# Patient Record
Sex: Female | Born: 1990 | Hispanic: No | Marital: Married | Smoking: Never smoker
Health system: Southern US, Community
[De-identification: ages and names within clinical notes are randomized; demographics above are authoritative.]

## PROBLEM LIST (undated history)

## (undated) DIAGNOSIS — Z8489 Family history of other specified conditions: Secondary | ICD-10-CM

## (undated) DIAGNOSIS — D649 Anemia, unspecified: Secondary | ICD-10-CM

## (undated) DIAGNOSIS — K219 Gastro-esophageal reflux disease without esophagitis: Secondary | ICD-10-CM

## (undated) HISTORY — PX: WISDOM TOOTH EXTRACTION: SHX21

## (undated) HISTORY — PX: LASIK: SHX215

---
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
## 2011-03-16 LAB — URINE MICROSCOPIC-ADD ON

## 2011-03-16 LAB — URINALYSIS, ROUTINE W REFLEX MICROSCOPIC
Bilirubin Urine: NEGATIVE
Glucose, UA: NEGATIVE mg/dL
Nitrite: NEGATIVE
Protein, ur: 100 mg/dL — AB
Specific Gravity, Urine: 1.015 (ref 1.005–1.030)
Urobilinogen, UA: 0.2 mg/dL (ref 0.0–1.0)

## 2011-03-16 LAB — URINE CULTURE

## 2019-10-28 ENCOUNTER — Encounter: Payer: Self-pay | Admitting: Obstetrics and Gynecology

## 2019-10-28 ENCOUNTER — Ambulatory Visit (INDEPENDENT_AMBULATORY_CARE_PROVIDER_SITE_OTHER): Payer: BC Managed Care – PPO | Admitting: Obstetrics and Gynecology

## 2019-10-28 ENCOUNTER — Other Ambulatory Visit (HOSPITAL_COMMUNITY)
Admission: RE | Admit: 2019-10-28 | Discharge: 2019-10-28 | Disposition: A | Discharge status: Home / Self Care | Payer: BC Managed Care – PPO | Source: Ambulatory Visit | Attending: Obstetrics and Gynecology | Admitting: Obstetrics and Gynecology

## 2019-10-28 ENCOUNTER — Other Ambulatory Visit: Payer: Self-pay

## 2019-10-28 VITALS — BP 105/76 | HR 70 | Ht 65.0 in | Wt 158.6 lb

## 2019-10-28 DIAGNOSIS — Z0000 Encounter for general adult medical examination without abnormal findings: Secondary | ICD-10-CM

## 2019-10-28 DIAGNOSIS — Z6826 Body mass index (BMI) 26.0-26.9, adult: Secondary | ICD-10-CM | POA: Diagnosis not present

## 2019-10-28 NOTE — Progress Notes (Signed)
Subjective:     Linda Madden is a single 28 y.o. female and is here for a comprehensive physical exam. She is not currently sexually active. 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 Lives with her mother. Is not currently exercising. The patient reports no problems.  Social History   Socioeconomic History  . Marital status: Single    Spouse name: Not on file  . Number of children: 0  . Years of education: Not on file  . Highest education level: Not on file  Occupational History  . Not on file  Social Needs  . Financial resource strain: Not on file  . Food insecurity    Worry: Not on file    Inability: Not on file  . Transportation needs    Medical: Not on file    Non-medical: Not on file  Tobacco Use  . Smoking status: Never Smoker  . Smokeless tobacco: Never Used  Substance and Sexual Activity  . Alcohol use: Never    Frequency: Never  . Drug use: Never  . Sexual activity: Not Currently  Lifestyle  . Physical activity    Days per week: 0 days    Minutes per session: 0 min  . Stress: Not on file  Relationships  . Social Herbalist on phone: Not on file    Gets together: Not on file    Attends religious service: Not on file    Active member of club or organization: Not on file    Attends meetings of clubs or organizations: Not on file    Relationship status: Not on file  . Intimate partner violence    Fear of current or ex partner: Not on file    Emotionally abused: Not on file    Physically abused: Not on file    Forced sexual activity: Not on file  Other Topics Concern  . Not on file  Social History Narrative  . Not on file   Health Maintenance  Topic Date Due  . HIV Screening  12/28/2007  . TETANUS/TDAP  12/28/2011  . PAP-Cervical Cytology Screening  12/27/2013  . PAP SMEAR-Modifier  12/27/2013  . INFLUENZA VACCINE  07/30/2019    The following portions of the patient's history were reviewed and updated as appropriate:  allergies, current medications, past family history, past medical history, past social history, past surgical history and problem list.  Review of Systems A comprehensive review of systems was negative.   Objective:    BP 105/76   Pulse 70   Ht 5\' 5"  (1.651 m)   Wt 158 lb 9 oz (71.9 kg)   LMP 10/17/2019 (Exact Date)   BMI 26.39 kg/m  General appearance: alert, cooperative and appears stated age Neck: no adenopathy, no carotid bruit, no JVD, supple, symmetrical, trachea midline and thyroid not enlarged, symmetric, no tenderness/mass/nodules Lungs: clear to auscultation bilaterally Breasts: normal appearance, no masses or tenderness Heart: regular rate and rhythm, S1, S2 normal, no murmur, click, rub or gallop Abdomen: soft, non-tender; bowel sounds normal; no masses,  no organomegaly Pelvic: cervix normal in appearance, external genitalia normal, no adnexal masses or tenderness, no cervical motion tenderness, rectovaginal septum normal, uterus normal size, shape, and consistency and vagina normal without discharge    Assessment:    Healthy female exam. BMI 26     Plan:  Reassured of normal findings. RTC 1 year or as needed. Labs & pap obtained-will follow up accordingly.  Tanishia Lemaster,CNM   See  After Visit Summary for Counseling Recommendations

## 2019-10-28 NOTE — Patient Instructions (Signed)
Place annual gynecologic exam patient instructions here.

## 2019-10-28 NOTE — Progress Notes (Signed)
New patient is here for an establishment visit. Patient states she has never had a pap smear. Is not on birth control.

## 2019-10-29 LAB — LIPID PANEL
Chol/HDL Ratio: 3.7 ratio (ref 0.0–4.4)
Cholesterol, Total: 173 mg/dL (ref 100–199)
HDL: 47 mg/dL (ref >39)
LDL Chol Calc (NIH): 113 mg/dL — ABNORMAL HIGH (ref 0–99)
Triglycerides: 70 mg/dL (ref 0–149)
VLDL Cholesterol Cal: 13 mg/dL (ref 5–40)

## 2019-10-29 LAB — COMPREHENSIVE METABOLIC PANEL
ALT: 20 IU/L (ref 0–32)
AST: 17 IU/L (ref 0–40)
Albumin/Globulin Ratio: 1.6 (ref 1.2–2.2)
Albumin: 4.8 g/dL (ref 3.9–5.0)
Alkaline Phosphatase: 57 IU/L (ref 39–117)
BUN/Creatinine Ratio: 14 (ref 9–23)
BUN: 9 mg/dL (ref 6–20)
Bilirubin Total: 0.3 mg/dL (ref 0.0–1.2)
CO2: 23 mmol/L (ref 20–29)
Calcium: 9.6 mg/dL (ref 8.7–10.2)
Chloride: 101 mmol/L (ref 96–106)
Creatinine, Ser: 0.66 mg/dL (ref 0.57–1.00)
GFR calc Af Amer: 141 mL/min/{1.73_m2} (ref >59)
GFR calc non Af Amer: 122 mL/min/{1.73_m2} (ref >59)
Globulin, Total: 3 g/dL (ref 1.5–4.5)
Glucose: 85 mg/dL (ref 65–99)
Potassium: 3.8 mmol/L (ref 3.5–5.2)
Sodium: 139 mmol/L (ref 134–144)
Total Protein: 7.8 g/dL (ref 6.0–8.5)

## 2019-10-29 LAB — CBC
Hematocrit: 37.3 % (ref 34.0–46.6)
Hemoglobin: 12.3 g/dL (ref 11.1–15.9)
MCH: 27.5 pg (ref 26.6–33.0)
MCHC: 33 g/dL (ref 31.5–35.7)
MCV: 83 fL (ref 79–97)
Platelets: 286 10*3/uL (ref 150–450)
RBC: 4.48 x10E6/uL (ref 3.77–5.28)
RDW: 13.4 % (ref 11.7–15.4)
WBC: 3.9 10*3/uL (ref 3.4–10.8)

## 2019-10-29 LAB — FERRITIN: Ferritin: 14 ng/mL — ABNORMAL LOW (ref 15–150)

## 2019-11-02 LAB — CYTOLOGY - PAP
Chlamydia: NEGATIVE
Comment: NEGATIVE
Comment: NORMAL
Diagnosis: NEGATIVE
Neisseria Gonorrhea: NEGATIVE

## 2021-05-22 ENCOUNTER — Ambulatory Visit: Payer: BC Managed Care – PPO | Admitting: Internal Medicine

## 2022-01-08 ENCOUNTER — Encounter: Payer: Self-pay | Admitting: Obstetrics and Gynecology

## 2022-01-08 ENCOUNTER — Ambulatory Visit: Payer: Managed Care, Other (non HMO) | Admitting: Obstetrics and Gynecology

## 2022-01-08 ENCOUNTER — Other Ambulatory Visit: Payer: Self-pay

## 2022-01-08 ENCOUNTER — Other Ambulatory Visit (HOSPITAL_COMMUNITY)
Admission: RE | Admit: 2022-01-08 | Discharge: 2022-01-08 | Disposition: A | Discharge status: Home / Self Care | Payer: Managed Care, Other (non HMO) | Source: Ambulatory Visit | Attending: Obstetrics and Gynecology | Admitting: Obstetrics and Gynecology

## 2022-01-08 VITALS — BP 124/88 | HR 77 | Ht 65.0 in | Wt 166.0 lb

## 2022-01-08 DIAGNOSIS — Z789 Other specified health status: Secondary | ICD-10-CM

## 2022-01-08 DIAGNOSIS — N923 Ovulation bleeding: Secondary | ICD-10-CM | POA: Diagnosis not present

## 2022-01-08 NOTE — Progress Notes (Signed)
° ° °  GYNECOLOGY PROGRESS NOTE  Subjective:    Patient ID: Linda Madden, female    DOB: [birth year], 31 y.o.   MRN: [id number]  HPI  Patient is a 31 y.o. G0P0000 female who presents for complaints of irregular cycles. Previously has a h/o normal cycles, lasting 6 days, occurring regularly every 28-30 days.  October-November began having spotting in between, usually 1-2 days.  Then November-December had 10 days of spotting with only 4 day cycle.  Then between December-January had 6 days of spotting, but cycle returned to 6 days. Reports that she and her husband have been attempting conception since July of last year. Has never been on birth control. Denies any postcoital bleeding or vaginal discharge. Has been tracking cycles and using ovulation kits, getting positive results. Denies any recent stressors, significant weight changes, new medications.   Last pap smear was 09/2019 with Melody Shambley,CNM, and was normal.   The following portions of the patient's history were reviewed and updated as appropriate:  She  has no past medical history on file.  She  has a past surgical history that includes Wisdom tooth extraction and LASIK.  Her family history includes Cancer in her mother; Thyroid disease in her mother.  She  reports that she has never smoked. She has never used smokeless tobacco. She reports that she does not drink alcohol and does not use drugs.  Current Outpatient Medications on File Prior to Visit  Medication Sig Dispense Refill   loratadine (CLARITIN) 10 MG tablet Take 10 mg by mouth daily.     No current facility-administered medications on file prior to visit.   She has No Known Allergies..  Review of Systems Pertinent items noted in HPI and remainder of comprehensive ROS otherwise negative.   Objective:   Blood pressure 124/88, pulse 77, height 5\' 5"  (1.651 m), weight 166 lb (75.3 kg), last menstrual period 01/04/2022, SpO2 99 %. Body mass index is 27.62  kg/m. General appearance: alert and no distress Abdomen: soft, non-tender; bowel sounds normal; no masses,  no organomegaly Pelvic: external genitalia normal, rectovaginal septum normal.  Vagina with scant thin white discharge.  Cervix normal appearing, no lesions and no motion tenderness, scant clear mucoid discharge present.  Uterus mobile, nontender, normal shape and size.  Adnexae non-palpable, nontender bilaterally.  Extremities: extremities normal, atraumatic, no cyanosis or edema Neurologic: Grossly normal   Assessment:   1. Intermenstrual bleeding   2. Attempting to conceive     Plan:   - Unclear cause new onset of intermenstrual spotting. Nuswab performed today to r/o cervicitis (although patient denies any symptoms).  Will also check hormone levels. She will soon be ovulating in a few days.  Lastly, will schedule ultrasound to rule out possible polyp or other structural cause for the bleeding.  - Will notify patient of all results via Mychart, and schedule appropriate follow up as mandated.  - Attempting to conceive, discussed timed coitus, continue use of ovulation kits and menstrual cycle calendar.    Rubie Maid, MD [facility]

## 2022-01-08 NOTE — Patient Instructions (Signed)
Dysfunctional Uterine Bleeding Dysfunctional uterine bleeding is abnormal bleeding from the uterus. Dysfunctional uterine bleeding includes: A menstrual period that comes earlier or later than usual. A menstrual period that is lighter or heavier than usual, or has large blood clots. Vaginal bleeding between menstrual periods. Skipping one or more menstrual periods. Vaginal bleeding after sex. Vaginal bleeding after menopause. Follow these instructions at home: Eating and drinking  Eat well-balanced meals. Include foods that are high in iron, such as liver, meat, shellfish, green leafy vegetables, and eggs. To prevent or treat constipation, your health care provider may recommend that you: Drink enough fluid to keep your urine pale yellow. Take over-the-counter or prescription medicines. Eat foods that are high in fiber, such as beans, whole grains, and fresh fruits and vegetables. Limit foods that are high in fat and processed sugars, such as fried or sweet foods. Medicines Take over-the-counter and prescription medicines only as told by your health care provider. Do not change medicines without talking with your health care provider. Aspirin or medicines that contain aspirin may make the bleeding worse. Do not take those medicines: During the week before your menstrual period. During your menstrual period. If you were prescribed iron pills, take them as told by your health care provider. Iron pills help to replace iron that your body loses because of this condition. Activity If you need to change your sanitary pad or tampon more than one time every 2 hours: Lie in bed with your feet raised (elevated). Place a cold pack on your lower abdomen. Rest as much as possible until the bleeding stops or slows down. Do not try to lose weight until the bleeding has stopped and your blood iron level is back to normal. General instructions  For two months, write down: When your menstrual period  starts. When your menstrual period ends. When any abnormal vaginal bleeding occurs. What problems you notice. Keep all follow up visits as told by your health care provider. This is important. Contact a health care provider if you: Feel light-headed or weak. Have nausea and vomiting. Cannot eat or drink without vomiting. Feel dizzy or have diarrhea while you are taking medicines. Are taking birth control pills or hormones, and you want to change them or stop taking them. Get help right away if: You develop a fever or chills. You need to change your sanitary pad or tampon more than one time per hour. Your vaginal bleeding becomes heavier, or your flow contains clots more often. You develop pain in your abdomen. You lose consciousness. You develop a rash. Summary Dysfunctional uterine bleeding is abnormal bleeding from the uterus. It includes menstrual bleeding of abnormal duration, volume, or regularity. Bleeding after sex and after menopause are also considered dysfunctional uterine bleeding. This information is not intended to replace advice given to you by your health care provider. Make sure you discuss any questions you have with your health care provider. Document Revised: 05/26/2018 Document Reviewed: 05/26/2018 Elsevier Patient Education  2022 Elsevier Inc.  

## 2022-01-09 LAB — FSH/LH
FSH: 4.4 m[IU]/mL
LH: 6 m[IU]/mL

## 2022-01-09 LAB — ESTRADIOL: Estradiol: 75.6 pg/mL

## 2022-01-09 LAB — PROGESTERONE: Progesterone: 0.2 ng/mL

## 2022-01-10 ENCOUNTER — Ambulatory Visit (INDEPENDENT_AMBULATORY_CARE_PROVIDER_SITE_OTHER): Payer: Managed Care, Other (non HMO)

## 2022-01-10 ENCOUNTER — Other Ambulatory Visit: Payer: Self-pay

## 2022-01-10 DIAGNOSIS — N923 Ovulation bleeding: Secondary | ICD-10-CM

## 2022-01-10 LAB — CERVICOVAGINAL ANCILLARY ONLY
Bacterial Vaginitis (gardnerella): NEGATIVE
Candida Glabrata: NEGATIVE
Candida Vaginitis: NEGATIVE
Chlamydia: NEGATIVE
Comment: NEGATIVE
Comment: NEGATIVE
Comment: NEGATIVE
Comment: NEGATIVE
Comment: NEGATIVE
Comment: NORMAL
Neisseria Gonorrhea: NEGATIVE
Trichomonas: NEGATIVE

## 2022-01-15 ENCOUNTER — Encounter: Payer: Self-pay | Admitting: Obstetrics and Gynecology

## 2022-01-17 MED ORDER — PROGESTERONE MICRONIZED 100 MG PO CAPS: 100.0000 mg | ORAL_CAPSULE | Freq: Every day | ORAL | 3 refills | Status: DC

## 2022-01-26 ENCOUNTER — Encounter: Payer: Self-pay | Admitting: Obstetrics and Gynecology

## 2022-01-27 ENCOUNTER — Emergency Department: Payer: Managed Care, Other (non HMO)

## 2022-01-27 ENCOUNTER — Encounter: Payer: Self-pay | Admitting: Emergency Medicine

## 2022-01-27 ENCOUNTER — Emergency Department
Admission: EM | Admit: 2022-01-27 | Discharge: 2022-01-27 | Disposition: A | Discharge status: Home / Self Care | Payer: Managed Care, Other (non HMO) | Attending: Emergency Medicine | Admitting: Emergency Medicine

## 2022-01-27 ENCOUNTER — Other Ambulatory Visit: Payer: Self-pay

## 2022-01-27 DIAGNOSIS — O3680X0 Pregnancy with inconclusive fetal viability, not applicable or unspecified: Secondary | ICD-10-CM

## 2022-01-27 DIAGNOSIS — O209 Hemorrhage in early pregnancy, unspecified: Secondary | ICD-10-CM | POA: Diagnosis present

## 2022-01-27 DIAGNOSIS — N9489 Other specified conditions associated with female genital organs and menstrual cycle: Secondary | ICD-10-CM | POA: Insufficient documentation

## 2022-01-27 DIAGNOSIS — O4690 Antepartum hemorrhage, unspecified, unspecified trimester: Secondary | ICD-10-CM

## 2022-01-27 DIAGNOSIS — N939 Abnormal uterine and vaginal bleeding, unspecified: Secondary | ICD-10-CM

## 2022-01-27 LAB — HCG, QUANTITATIVE, PREGNANCY: hCG, Beta Chain, Quant, S: 30 m[IU]/mL — ABNORMAL HIGH (ref <5)

## 2022-01-27 LAB — URINALYSIS, ROUTINE W REFLEX MICROSCOPIC
Bilirubin Urine: NEGATIVE
Glucose, UA: NEGATIVE mg/dL
Ketones, ur: NEGATIVE mg/dL
Leukocytes,Ua: NEGATIVE
Nitrite: NEGATIVE
Protein, ur: NEGATIVE mg/dL
Specific Gravity, Urine: 1.015 (ref 1.005–1.030)
pH: 8.5 — ABNORMAL HIGH (ref 5.0–8.0)

## 2022-01-27 LAB — BASIC METABOLIC PANEL
Anion gap: 12 (ref 5–15)
BUN: 10 mg/dL (ref 6–20)
CO2: 23 mmol/L (ref 22–32)
Calcium: 9.4 mg/dL (ref 8.9–10.3)
Chloride: 101 mmol/L (ref 98–111)
Creatinine, Ser: 0.7 mg/dL (ref 0.44–1.00)
GFR, Estimated: >60 mL/min (ref >60)
Glucose, Bld: 83 mg/dL (ref 70–99)
Potassium: 3.6 mmol/L (ref 3.5–5.1)
Sodium: 136 mmol/L (ref 135–145)

## 2022-01-27 LAB — CBC WITH DIFFERENTIAL/PLATELET
Abs Immature Granulocytes: 0.02 10*3/uL (ref 0.00–0.07)
Basophils Absolute: 0.1 10*3/uL (ref 0.0–0.1)
Basophils Relative: 1 %
Eosinophils Absolute: 0.1 10*3/uL (ref 0.0–0.5)
Eosinophils Relative: 2 %
HCT: 40 % (ref 36.0–46.0)
Hemoglobin: 13.2 g/dL (ref 12.0–15.0)
Immature Granulocytes: 0 %
Lymphocytes Relative: 26 %
Lymphs Abs: 2.3 10*3/uL (ref 0.7–4.0)
MCH: 28.4 pg (ref 26.0–34.0)
MCHC: 33 g/dL (ref 30.0–36.0)
MCV: 86.2 fL (ref 80.0–100.0)
Monocytes Absolute: 0.7 10*3/uL (ref 0.1–1.0)
Monocytes Relative: 8 %
Neutro Abs: 5.5 10*3/uL (ref 1.7–7.7)
Neutrophils Relative %: 63 %
Platelets: 321 10*3/uL (ref 150–400)
RBC: 4.64 MIL/uL (ref 3.87–5.11)
RDW: 12.7 % (ref 11.5–15.5)
WBC: 8.7 10*3/uL (ref 4.0–10.5)
nRBC: 0 % (ref 0.0–0.2)

## 2022-01-27 LAB — URINALYSIS, MICROSCOPIC (REFLEX): RBC / HPF: >50 RBC/hpf (ref 0–5)

## 2022-01-27 LAB — POC URINE PREG, ED: Preg Test, Ur: POSITIVE — AB

## 2022-01-27 LAB — ABO/RH: ABO/RH(D): B NEG

## 2022-01-27 NOTE — ED Triage Notes (Signed)
Pt via POV from home. Pt c/o vaginal bleeding since yesterday. States that she is also having lower abd cramping. Pt is a couple weeks pregnant. Pt is A&OX4 and NAD

## 2022-01-27 NOTE — Discharge Instructions (Signed)
Call your OB/GYN office to let them know that you were seen in the emergency department.  You should have a repeat beta in 72 hours.  Also it was too early to see anything on ultrasound and the radiologist is suggesting that a repeat ultrasound be done in 14 days.  Take it easy for right now.  Avoid medications other than what is recommended by your OB/GYN.

## 2022-01-27 NOTE — ED Provider Notes (Signed)
[facility] Provider Note    Event Date/Time   First MD Initiated Contact with Patient 01/27/22 1238     (approximate)   History   Vaginal Bleeding   HPI  Linda Madden is a 31 y.o. female presents to the ED with complaint of vaginal bleeding that started yesterday.  Patient states that she had some spotting a panty liner yesterday.  She then went home and rested for the remainder of the day and it had stopped.  This morning when she got up she again had some spotting.  She is also complaining of some cramping but no localized pain.  She denies any fever, chills or urinary symptoms.  Patient took a home pregnancy test which was positive.     Physical Exam   Triage Vital Signs: ED Triage Vitals  Enc Vitals Group     BP 01/27/22 1213 111/75     Pulse Rate 01/27/22 1213 75     Resp 01/27/22 1213 18     Temp 01/27/22 1213 98.8 F (37.1 C)     Temp Source 01/27/22 1213 Oral     SpO2 01/27/22 1213 98 %     Weight 01/27/22 1151 162 lb (73.5 kg)     Height 01/27/22 1151 5\' 5"  (1.651 m)     Head Circumference --      Peak Flow --      Pain Score 01/27/22 1151 4     Pain Loc --      Pain Edu? --      Excl. in GC? --     Most recent vital signs: Vitals:   01/27/22 1400 01/27/22 1415  BP: 106/70   Pulse: 68 (!) 32  Resp: 18   Temp:    SpO2: 98% 100%     General: Awake, no distress.  Alert, talkative and cooperative. CV:  Good peripheral perfusion.  Heart regular rate and rhythm without murmur. Resp:  Normal effort.  Lungs are clear bilaterally. Abd:  No distention.  Soft, nontender, bowel sounds normoactive x4 quadrants.    ED Results / Procedures / Treatments   Labs (all labs ordered are listed, but only abnormal results are displayed) Labs Reviewed  HCG, QUANTITATIVE, PREGNANCY - Abnormal; Notable for the following components:      Result Value   hCG, Beta Chain, Quant, S 30 (*)    All other components within normal limits   URINALYSIS, ROUTINE W REFLEX MICROSCOPIC - Abnormal; Notable for the following components:   pH 8.5 (*)    Hgb urine dipstick LARGE (*)    All other components within normal limits  URINALYSIS, MICROSCOPIC (REFLEX) - Abnormal; Notable for the following components:   Bacteria, UA RARE (*)    All other components within normal limits  POC URINE PREG, ED - Abnormal; Notable for the following components:   Preg Test, Ur Positive (*)    All other components within normal limits  CBC WITH DIFFERENTIAL/PLATELET  BASIC METABOLIC PANEL  ABO/RH     RADIOLOGY  Ultrasound image was reviewed and radiology report.  This time there is no IUP, fetal pole, heart beat noted.  Radiology reports that it is too early to see and cannot exclude a ectopic pregnancy.    PROCEDURES:  Critical Care performed:   Procedures   MEDICATIONS ORDERED IN ED: Medications - No data to display   IMPRESSION / MDM / ASSESSMENT AND PLAN / ED COURSE  I reviewed the triage vital signs  and the nursing notes.   Differential diagnosis includes, but is not limited to, early pregnancy, spontaneous abortion, bleeding with pregnancy, ectopic pregnancy,  31 year old female presents to the ED with complaint of some spotting yesterday while wearing a panty liner.  She states that she went home and rested and bleeding stopped.  She states this morning that she saw some more blood but has not completely filled 1 panty liner today.  Patient denies any abdominal pain but states there has been some cramping.  CBC was reassuring along with basic metabolic panel.  hCG quantitative is 30, ABO/Rh was being negative, and urinalysis showed large amount of blood most likely vaginal contaminant.  Review of ultrasound and radiology report that it is too early to visualize a fetal pole, heartbeat, IUP and should be repeated in 14 days.  Patient already is established with Dr. Valentino Saxon who will be her OB/GYN.  She will also call to make  arrangements to have a beta-hCG in 72 hours and a repeat ultrasound in the office.     FINAL CLINICAL IMPRESSION(S) / ED DIAGNOSES   Final diagnoses:  Pregnancy of unknown anatomic location  Vaginal bleeding in pregnancy     Rx / DC Orders   ED Discharge Orders     None        Note:  This document was prepared using Dragon voice recognition software and may include unintentional dictation errors.   Tommi Rumps, PA-C 01/27/22 1631    Georga Hacking, MD 01/28/22 0700

## 2022-01-28 ENCOUNTER — Other Ambulatory Visit: Payer: Self-pay | Admitting: Obstetrics and Gynecology

## 2022-01-28 DIAGNOSIS — O209 Hemorrhage in early pregnancy, unspecified: Secondary | ICD-10-CM

## 2022-01-30 ENCOUNTER — Other Ambulatory Visit: Payer: Managed Care, Other (non HMO)

## 2022-01-30 ENCOUNTER — Other Ambulatory Visit: Payer: Self-pay

## 2022-01-30 DIAGNOSIS — O209 Hemorrhage in early pregnancy, unspecified: Secondary | ICD-10-CM

## 2022-01-31 LAB — HUMAN CHORIONIC GONADOTROPIN(HCG),B-SUBUNIT,QUANTITATIVE): HCG, Beta Chain, Quant, S: 105 m[IU]/mL

## 2022-02-14 ENCOUNTER — Emergency Department: Payer: Managed Care, Other (non HMO)

## 2022-02-14 ENCOUNTER — Other Ambulatory Visit: Payer: Self-pay

## 2022-02-14 ENCOUNTER — Emergency Department
Admission: EM | Admit: 2022-02-14 | Discharge: 2022-02-14 | Disposition: A | Discharge status: Home / Self Care | Payer: Managed Care, Other (non HMO) | Attending: Emergency Medicine | Admitting: Emergency Medicine

## 2022-02-14 DIAGNOSIS — O26891 Other specified pregnancy related conditions, first trimester: Secondary | ICD-10-CM | POA: Insufficient documentation

## 2022-02-14 DIAGNOSIS — Z20822 Contact with and (suspected) exposure to covid-19: Secondary | ICD-10-CM | POA: Insufficient documentation

## 2022-02-14 DIAGNOSIS — R1031 Right lower quadrant pain: Secondary | ICD-10-CM | POA: Insufficient documentation

## 2022-02-14 DIAGNOSIS — Z3A00 Weeks of gestation of pregnancy not specified: Secondary | ICD-10-CM | POA: Insufficient documentation

## 2022-02-14 DIAGNOSIS — N939 Abnormal uterine and vaginal bleeding, unspecified: Secondary | ICD-10-CM

## 2022-02-14 DIAGNOSIS — O209 Hemorrhage in early pregnancy, unspecified: Secondary | ICD-10-CM | POA: Diagnosis not present

## 2022-02-14 DIAGNOSIS — O00101 Right tubal pregnancy without intrauterine pregnancy: Secondary | ICD-10-CM

## 2022-02-14 LAB — CBC WITH DIFFERENTIAL/PLATELET
Abs Immature Granulocytes: 0.03 10*3/uL (ref 0.00–0.07)
Basophils Absolute: 0 10*3/uL (ref 0.0–0.1)
Basophils Relative: 0 %
Eosinophils Absolute: 0.1 10*3/uL (ref 0.0–0.5)
Eosinophils Relative: 2 %
HCT: 36.3 % (ref 36.0–46.0)
Hemoglobin: 12 g/dL (ref 12.0–15.0)
Immature Granulocytes: 0 %
Lymphocytes Relative: 24 %
Lymphs Abs: 1.9 10*3/uL (ref 0.7–4.0)
MCH: 28.1 pg (ref 26.0–34.0)
MCHC: 33.1 g/dL (ref 30.0–36.0)
MCV: 85 fL (ref 80.0–100.0)
Monocytes Absolute: 0.5 10*3/uL (ref 0.1–1.0)
Monocytes Relative: 6 %
Neutro Abs: 5.3 10*3/uL (ref 1.7–7.7)
Neutrophils Relative %: 68 %
Platelets: 281 10*3/uL (ref 150–400)
RBC: 4.27 MIL/uL (ref 3.87–5.11)
RDW: 13.1 % (ref 11.5–15.5)
WBC: 7.9 10*3/uL (ref 4.0–10.5)
nRBC: 0 % (ref 0.0–0.2)

## 2022-02-14 LAB — TYPE AND SCREEN
ABO/RH(D): B NEG
Antibody Screen: NEGATIVE

## 2022-02-14 LAB — BASIC METABOLIC PANEL
Anion gap: 7 (ref 5–15)
BUN: 9 mg/dL (ref 6–20)
CO2: 27 mmol/L (ref 22–32)
Calcium: 9.7 mg/dL (ref 8.9–10.3)
Chloride: 104 mmol/L (ref 98–111)
Creatinine, Ser: 0.55 mg/dL (ref 0.44–1.00)
GFR, Estimated: >60 mL/min (ref >60)
Glucose, Bld: 106 mg/dL — ABNORMAL HIGH (ref 70–99)
Potassium: 3.9 mmol/L (ref 3.5–5.1)
Sodium: 138 mmol/L (ref 135–145)

## 2022-02-14 LAB — HCG, QUANTITATIVE, PREGNANCY: hCG, Beta Chain, Quant, S: 3117 m[IU]/mL — ABNORMAL HIGH (ref <5)

## 2022-02-14 LAB — RESP PANEL BY RT-PCR (FLU A&B, COVID) ARPGX2
Influenza A by PCR: NEGATIVE
Influenza B by PCR: NEGATIVE
SARS Coronavirus 2 by RT PCR: NEGATIVE

## 2022-02-14 MED ORDER — METHOTREXATE FOR ECTOPIC PREGNANCY
50.0000 mg/m2 | Freq: Once | INTRAMUSCULAR | Status: AC
  Administered 2022-02-14: 92.5 mg via INTRAMUSCULAR
  Filled 2022-02-14: qty 3.7

## 2022-02-14 MED ORDER — RHO D IMMUNE GLOBULIN 1500 UNIT/2ML IJ SOSY
300.0000 ug | PREFILLED_SYRINGE | Freq: Once | INTRAMUSCULAR | Status: AC
Start: 2022-02-14 — End: 2022-02-14
  Administered 2022-02-14: 300 ug via INTRAMUSCULAR
  Filled 2022-02-14: qty 2

## 2022-02-14 NOTE — ED Notes (Signed)
See triage note  states she is approx [redacted] weeks pregnant  has had some vaginal bleeding daily  but states developed pain to RLQ this am

## 2022-02-14 NOTE — ED Notes (Signed)
Attempted for 20g at R ac and 22g at L ac. Will have other RN try.

## 2022-02-14 NOTE — Consult Note (Signed)
Consult Note  Requesting Attending Physician :  Concha Se, MD Service Requesting Consult : ED Assessment/Recommendations:  Linda Madden is a 31 y.o. female seen in consultation at the request of Concha Se, MD regarding possible ectopic pregnancy.  Quantitative beta-hCG greater than 3000 Nothing in the uterus Right adnexal mass likely consistent with small ectopic pregnancy Very small amount of free fluid/blood in the right adnexa.   Based on the above findings and believe that she has a small right ectopic pregnancy and likely have a small amount of hemorrhage from the fallopian tube end.  Doubt tubal rupture based on very small amount of blood and signs of ectopic.  Amount of free fluid is very little and appears to be stable.  Patient has no peritoneal signs and in fact states that the pain has now resolved.  Plan: I discussed multiple options with the patient and the risk benefits of each option were reviewed.  These options include methotrexate for ectopic pregnancy and exploratory laparoscopy. She has chosen methotrexate. We will plan for methotrexate with close follow-up.  Peritoneal signs and warnings for tubal rupture discussed.  Quantitative beta-hCG is on day 4 and day 7.  History of present illness    Patient began having right-sided pelvic pain approximately 2 days ago.  She was attempting pregnancy.  She also reported a small amount of vaginal spotting. She denies lightheadedness, significant pelvic pain, nausea vomiting or diarrhea. She denies history of STDs, abdominal surgery, or pelvic infection.  Obstetric History:  OB History  Gravida Para Term Preterm AB Living  1 0 0 0 0 0  SAB IAB Ectopic Multiple Live Births  0 0 0 0 0    # Outcome Date GA Lbr Len/2nd Weight Sex Delivery Anes PTL Lv  1 Current              Social History: Social History   Socioeconomic History   Marital status: Married    Spouse name: Not on file   Number of  children: 0   Years of education: Not on file   Highest education level: Not on file  Occupational History   Not on file  Tobacco Use   Smoking status: Never   Smokeless tobacco: Never  Substance and Sexual Activity   Alcohol use: Never   Drug use: Never   Sexual activity: Not Currently  Other Topics Concern   Not on file  Social History Narrative   Not on file   Social Determinants of Health   Financial Resource Strain: Not on file  Food Insecurity: Not on file  Transportation Needs: Not on file  Physical Activity: Not on file  Stress: Not on file  Social Connections: Not on file    Family History: family history includes Cancer in her mother; Thyroid disease in her mother.  Review of Systems:  See HPI for additional information.  There are otherwise no other significant findings on review of systems  Objective :  Vital signs in last 24 hours: Temp:  [98 F (36.7 C)-98.3 F (36.8 C)] 98 F (36.7 C) (02/17 1723) Pulse Rate:  [78-99] 99 (02/17 1723) Resp:  [16-19] 16 (02/17 1723) BP: (101-118)/(65-87) 101/65 (02/17 1723) SpO2:  [98 %-100 %] 100 % (02/17 1723) Weight:  [73.9 kg] 73.9 kg (02/17 0945)  Intake/Output last 3 shifts: No intake/output data recorded.   Physical Exam: Abdomen is soft nontender.  No rebound no guarding Ultrasound findings reviewed by me and also discussed  directly with radiology.   Elonda Husky, M.D. 02/14/2022 6:32 PM

## 2022-02-14 NOTE — ED Provider Notes (Signed)
°  Physical Exam  BP 101/65 (BP Location: Left Arm)    Pulse 99    Temp 98 F (36.7 C) (Oral)    Resp 16    Ht 5\' 5"  (1.651 m)    Wt 73.9 kg    LMP 12/30/2021    SpO2 100%    BMI 27.12 kg/m   Physical Exam General:          Awake, no distress.  CV:                  Good peripheral perfusion.  Heart regular rate and rhythm. Resp:               Normal effort.  Lungs are clear bilaterally. Abd:                 No distention.  Soft with minimal tenderness in the right lower quadrant area.  Bowel sounds normoactive x4 quadrants.  Procedures  Procedures  ED Course / MDM    Medical Decision Making Amount and/or Complexity of Data Reviewed Labs: ordered. Radiology: ordered.  Risk Prescription drug management.   This patient's care was transferred over to me by 02/27/2022, PA-C at approximately 1600.  Plan is to wait for on call OB/GYN specialist, Dr. Bridget Hartshorn to evaluate the patient.  I spoke with Dr. Logan Bores after he evaluated the patient.  After reviewing her imaging findings, he states that he believes that this is a small ectopic pregnancy, though does not appear to be a concerning amount of free fluid in the abdomen.  Patient is currently stable.  Will treat here with RhoGAM and methotrexate and discharge with close follow-up.  Provided the patient with anticipatory guidance and educational material.       Logan Bores, Varney Daily 02/14/22 1858    02/16/22, MD 02/14/22 1900

## 2022-02-14 NOTE — Discharge Instructions (Addendum)
-  Follow up with your OB/GYN provider as discussed -Return to the ED at any time if you begin to experience any new or worsening symptoms.

## 2022-02-14 NOTE — ED Provider Notes (Incomplete Revision)
[facility] Provider Note    Event Date/Time   First MD Initiated Contact with Patient 02/14/22 1030     (approximate)   History   Vaginal Bleeding   HPI  Linda Madden is a 31 y.o. female   presents to the ED with complaint of vaginal bleeding and right lower quadrant pain for the last 2 days.  Patient states that she was seen at the [facility] in [place] yesterday and was given progesterone.  No test were done.  This is her first pregnancy.  She denies any previous abdominal surgery or trauma.      Physical Exam   Triage Vital Signs: ED Triage Vitals [02/14/22 0945]  Enc Vitals Group     BP 104/81     Pulse Rate 80     Resp 19     Temp 98.3 F (36.8 C)     Temp Source Oral     SpO2 98 %     Weight 163 lb (73.9 kg)     Height _0  (1.651 m)     Head Circumference      Peak Flow      Pain Score      Pain Loc      Pain Edu?      Excl. in [place]?     Most recent vital signs: Vitals:   02/14/22 1158 02/14/22 1258  BP: 110/78 118/70  Pulse: 78 80  Resp: 18 18  Temp:    SpO2:  98%     General: Awake, no distress.  CV:  Good peripheral perfusion.  Heart regular rate and rhythm. Resp:  Normal effort.  Lungs are clear bilaterally. Abd:  No distention.  Soft with minimal tenderness in the right lower quadrant area.  Bowel sounds normoactive x4 quadrants.    ED Results / Procedures / Treatments   Labs (all labs ordered are listed, but only abnormal results are displayed) Labs Reviewed  BASIC METABOLIC PANEL - Abnormal; Notable for the following components:      Result Value   Glucose, Bld 106 (*)    All other components within normal limits  HCG, QUANTITATIVE, PREGNANCY - Abnormal; Notable for the following components:   hCG, Beta Chain, Quant, S 3,117 (*)    All other components within normal limits  RESP PANEL BY RT-PCR (FLU A&B, COVID) ARPGX2  CBC WITH DIFFERENTIAL/PLATELET  TYPE AND SCREEN      RADIOLOGY  Ultrasound OB less than 14 weeks radiology report suggest an ectopic pregnancy with fluid suspicious for either ruptured ectopic or a recent ruptured ovarian cyst.   PROCEDURES:  Critical Care performed:   Procedures   MEDICATIONS ORDERED IN ED: Medications - No data to display   IMPRESSION / MDM / [place] / ED COURSE  I reviewed the triage vital signs and the nursing notes.   Differential diagnosis includes, but is not limited to, vaginal bleeding in first trimester pregnancy, ectopic pregnancy, early spontaneous abortion, subchorionic hemorrhage.  ----------------------------------------- 2:38 PM on 02/14/2022 ----------------------------------------- Dr. Amalia Hailey paged Responded by secure chat at 2:55 PM.  ----------------------------------------- 4:04 PM on 02/14/2022 ----------------------------------------- Dr. Amalia Hailey paged.  ----------------------------------------- 4:20 PM on 02/14/2022 ----------------------------------------- Patient and family are aware that Dr. Amalia Hailey will be seeing her in the emergency department.  31 year old female presents to the ED with complaint of some light vaginal bleeding with right lower quadrant pain for the last 2 days.  She states that she was seen at the  the [facility] in [place] yesterday where she was given progesterone but [facility] [place].  Beta hCG was 3,117, CBC and Met B, were reassuring.  Radiologist called Dr. Kerman Passey and reported that patient has a right ectopic pregnancy with free fluid which may be related to a ruptured ectopic versus a recently ruptured ovarian cyst.  Respiratory panel is negative for COVID and influenza.  Dr. Amalia Hailey is [facility]-call for [facility] which is where this patient is established.  He has reviewed the ultrasound and does not believe that the ectopic has ruptured.  He is advised that he will be over to see the  patient in the emergency department.  RhoGAM has been ordered since patient as patient is B Negative.     FINAL CLINICAL IMPRESSION(S) / ED DIAGNOSES   Final diagnoses:  RLQ abdominal pain  Vaginal bleeding     Rx / DC Orders   ED Discharge Orders     None        Note:  This document was prepared using Dragon voice recognition software and may include unintentional dictation errors.   Johnn Hai, PA-C 02/14/22 1550

## 2022-02-14 NOTE — ED Provider Notes (Addendum)
[facility] Provider Note    Event Date/Time   First MD Initiated Contact with Patient 02/14/22 1030     (approximate)   History   Vaginal Bleeding   HPI  Linda Madden is a 31 y.o. female   presents to the ED with complaint of vaginal bleeding and right lower quadrant pain for the last 2 days.  Patient states that she was seen at the [facility] in [place] yesterday and was given progesterone.  No test were done.  This is her first pregnancy.  She denies any previous abdominal surgery or trauma.      Physical Exam   Triage Vital Signs: ED Triage Vitals [02/14/22 0945]  Enc Vitals Group     BP 104/81     Pulse Rate 80     Resp 19     Temp 98.3 F (36.8 C)     Temp Source Oral     SpO2 98 %     Weight 163 lb (73.9 kg)     Height _0  (1.651 m)     Head Circumference      Peak Flow      Pain Score      Pain Loc      Pain Edu?      Excl. in [place]?     Most recent vital signs: Vitals:   02/14/22 1158 02/14/22 1258  BP: 110/78 118/70  Pulse: 78 80  Resp: 18 18  Temp:    SpO2:  98%     General: Awake, no distress.  CV:  Good peripheral perfusion.  Heart regular rate and rhythm. Resp:  Normal effort.  Lungs are clear bilaterally. Abd:  No distention.  Soft with minimal tenderness in the right lower quadrant area.  Bowel sounds normoactive x4 quadrants.    ED Results / Procedures / Treatments   Labs (all labs ordered are listed, but only abnormal results are displayed) Labs Reviewed  BASIC METABOLIC PANEL - Abnormal; Notable for the following components:      Result Value   Glucose, Bld 106 (*)    All other components within normal limits  HCG, QUANTITATIVE, PREGNANCY - Abnormal; Notable for the following components:   hCG, Beta Chain, Quant, S 3,117 (*)    All other components within normal limits  RESP PANEL BY RT-PCR (FLU A&B, COVID) ARPGX2  CBC WITH DIFFERENTIAL/PLATELET  TYPE AND SCREEN      RADIOLOGY  Ultrasound OB less than 14 weeks radiology report suggest an ectopic pregnancy with fluid suspicious for either ruptured ectopic or a recent ruptured ovarian cyst.   PROCEDURES:  Critical Care performed:   Procedures   MEDICATIONS ORDERED IN ED: Medications - No data to display   IMPRESSION / MDM / [place] / ED COURSE  I reviewed the triage vital signs and the nursing notes.   Differential diagnosis includes, but is not limited to, vaginal bleeding in first trimester pregnancy, ectopic pregnancy, early spontaneous abortion, subchorionic hemorrhage.  ----------------------------------------- 2:38 PM on 02/14/2022 ----------------------------------------- Dr. Amalia Hailey paged Responded by secure chat at 2:55 PM.  ----------------------------------------- 4:04 PM on 02/14/2022 ----------------------------------------- Dr. Amalia Hailey paged.  ----------------------------------------- 4:20 PM on 02/14/2022 ----------------------------------------- Patient and family are aware that Dr. Amalia Hailey will be seeing her in the emergency department.  31 year old female presents to the ED with complaint of some light vaginal bleeding with right lower quadrant pain for the last 2 days.  She states that she was [facility] the  [facility] [place] yesterday where she was given progesterone but [facility] [place].  Beta hCG was 3,117, CBC and Met B, were reassuring.  Radiologist called Dr. Kerman Passey and reported that patient has a right ectopic pregnancy with free fluid which may be related to a ruptured ectopic versus a recently ruptured ovarian cyst.  Respiratory panel is negative for COVID and influenza.  Dr. Amalia Hailey is [facility]-call for [facility] which is where this patient is established.  He has reviewed the ultrasound and does not believe that the ectopic has ruptured.  He is advised that he will be over to see the  patient in the emergency department.  RhoGAM has been ordered since patient as patient is B Negative.     FINAL CLINICAL IMPRESSION(S) / ED DIAGNOSES   Final diagnoses:  RLQ abdominal pain  Vaginal bleeding     Rx / DC Orders   ED Discharge Orders     None        Note:  This document was prepared using Dragon voice recognition software and may include unintentional dictation errors.   Johnn Hai, PA-C 02/14/22 [place], PA-C 02/17/22 1203    Harvest Dark, MD 03/10/22 1429

## 2022-02-14 NOTE — ED Triage Notes (Addendum)
Pt c/o vaginal bleeding with RLQ pain for the past 2 days, states she was [facility] in [place] yesterday and given progesterone , states she is [redacted]wks pregnant

## 2022-02-15 LAB — RHOGAM INJECTION: Unit division: 0

## 2022-02-17 ENCOUNTER — Other Ambulatory Visit: Payer: Self-pay

## 2022-02-17 DIAGNOSIS — O00101 Right tubal pregnancy without intrauterine pregnancy: Secondary | ICD-10-CM

## 2022-02-18 ENCOUNTER — Other Ambulatory Visit: Payer: Managed Care, Other (non HMO)

## 2022-02-18 ENCOUNTER — Other Ambulatory Visit: Payer: Self-pay

## 2022-02-19 LAB — BETA HCG QUANT (REF LAB): hCG Quant: 75 m[IU]/mL

## 2022-02-21 ENCOUNTER — Other Ambulatory Visit: Payer: Self-pay

## 2022-02-21 ENCOUNTER — Other Ambulatory Visit: Payer: Managed Care, Other (non HMO)

## 2022-02-22 LAB — BETA HCG QUANT (REF LAB): hCG Quant: 21 m[IU]/mL

## 2022-02-27 ENCOUNTER — Other Ambulatory Visit: Payer: Self-pay | Admitting: Obstetrics and Gynecology

## 2022-02-27 ENCOUNTER — Other Ambulatory Visit: Payer: Managed Care, Other (non HMO)

## 2022-02-27 ENCOUNTER — Other Ambulatory Visit: Payer: Self-pay

## 2022-02-27 DIAGNOSIS — O00101 Right tubal pregnancy without intrauterine pregnancy: Secondary | ICD-10-CM

## 2022-02-28 LAB — BETA HCG QUANT (REF LAB): hCG Quant: 3 m[IU]/mL

## 2022-03-04 ENCOUNTER — Other Ambulatory Visit: Payer: Self-pay

## 2022-03-04 ENCOUNTER — Ambulatory Visit: Payer: Managed Care, Other (non HMO) | Admitting: Obstetrics and Gynecology

## 2022-03-04 ENCOUNTER — Encounter: Payer: Self-pay | Admitting: Obstetrics and Gynecology

## 2022-03-04 VITALS — BP 126/81 | HR 69 | Ht 65.0 in | Wt 173.3 lb

## 2022-03-04 DIAGNOSIS — O00109 Unspecified tubal pregnancy without intrauterine pregnancy: Secondary | ICD-10-CM

## 2022-03-04 NOTE — Progress Notes (Signed)
HPI: ?     [name] is a 31 y.o. G1P0000 who LMP was No LMP recorded (lmp unknown). ? ?Subjective:  ? ?She presents today for follow-up after ectopic pregnancy.  She was treated with methotrexate after being seen in the emergency department.  She has done well since that time and her quantitative beta hCGs have steadily declined to 3.  She is no longer bleeding. ?She denies any abdominal pain. ?She would like to become pregnant and is questioning how soon she can attempt pregnancy. ? ?  Hx: ?The following portions of the patient's history were reviewed and updated as appropriate: ?            She  has no past medical history on file. ?She does not have a problem list on file. ?She  has a past surgical history that includes Wisdom tooth extraction and LASIK. ?Her family history includes Cancer in her mother; Thyroid disease in her mother. ?She  reports that she has never smoked. She has never used smokeless tobacco. She reports that she does not drink alcohol and does not use drugs. ?She has a current medication list which includes the following prescription(s): loratadine. ?She has No Known Allergies. ?      ?Review of Systems:  ?Review of Systems ? ?Constitutional: Denied constitutional symptoms, night sweats, recent illness, fatigue, fever, insomnia and weight loss.  ?Eyes: Denied eye symptoms, eye pain, photophobia, vision change and visual disturbance.  ?Ears/Nose/Throat/Neck: Denied ear, nose, throat or neck symptoms, hearing loss, nasal discharge, sinus congestion and sore throat.  ?Cardiovascular: Denied cardiovascular symptoms, arrhythmia, chest pain/pressure, edema, exercise intolerance, orthopnea and palpitations.  ?Respiratory: Denied pulmonary symptoms, asthma, pleuritic pain, productive sputum, cough, dyspnea and wheezing.  ?Gastrointestinal: Denied, gastro-esophageal reflux, melena, nausea and vomiting.  ?Genitourinary: Denied genitourinary symptoms including symptomatic vaginal  discharge, pelvic relaxation issues, and urinary complaints.  ?Musculoskeletal: Denied musculoskeletal symptoms, stiffness, swelling, muscle weakness and myalgia.  ?Dermatologic: Denied dermatology symptoms, rash and scar.  ?Neurologic: Denied neurology symptoms, dizziness, headache, neck pain and syncope.  ?Psychiatric: Denied psychiatric symptoms, anxiety and depression.  ?Endocrine: Denied endocrine symptoms including hot flashes and night sweats.  ? ?Meds: ?  ?Current Outpatient Medications on File Prior to Visit  ?Medication Sig Dispense Refill  ? loratadine (CLARITIN) 10 MG tablet Take 10 mg by mouth daily.    ? ?No current facility-administered medications on file prior to visit.  ? ? ? ? ?Objective:  ?  ? ?Vitals:  ? 03/04/22 0817  ?BP: 126/81  ?Pulse: 69  ? ?Filed Weights  ? 03/04/22 0817  ?Weight: 173 lb 4.8 oz (78.6 kg)  ? ?  ?          ?        ? ?Assessment:  ?  ?G1P0000 ?There are no problems to display for this patient. ? ?  ?1. Tubal pregnancy without intrauterine pregnancy, unspecified laterality   ? ? Ectopic pregnancy resolved based on [place]. ? ? ?Plan:  ?  ?       ? 1.  Advised patient to refrain from becoming pregnant for greater than 3 months.  Rationale for physical and mental recovery discussed.  Continue prenatal vitamins until pregnant again.  Discussed return of menses in 4 to 6 weeks.  Timing of intercourse for pregnancy discussed.  All questions answered. ? ?Orders ?No orders of the defined types were placed in this encounter. ? ? No orders of the defined types were placed in this encounter. ?  ?  F/U ? Return for Annual Physical. ?I spent 21 minutes involved in the care of this patient preparing to see the patient by obtaining and reviewing her medical history (including labs, imaging tests and prior procedures), documenting clinical information in the electronic health record (EHR), counseling and coordinating care plans, writing and sending prescriptions, ordering tests or procedures  and in direct communicating with the patient and medical staff discussing pertinent items from her history and physical exam. ? ?Elonda Husky, M.D. ?03/04/2022 ?8:41 AM ? ? ? ? ?

## 2022-03-04 NOTE — Progress Notes (Signed)
Patient presents for follow-up due to recent ectopic pregnancy. She reports no pain, cramping or bleeding at this time. Patient states she would like to discuss next steps regarding any needed scans and when they can start to try and conceive again. Patient states no other questions or concerns at this time. ?

## 2022-04-07 ENCOUNTER — Other Ambulatory Visit: Payer: Self-pay | Admitting: Internal Medicine

## 2022-04-07 ENCOUNTER — Other Ambulatory Visit (HOSPITAL_COMMUNITY): Payer: Self-pay | Admitting: Internal Medicine

## 2022-04-07 DIAGNOSIS — E049 Nontoxic goiter, unspecified: Secondary | ICD-10-CM

## 2022-04-07 DIAGNOSIS — R946 Abnormal results of thyroid function studies: Secondary | ICD-10-CM

## 2022-04-07 DIAGNOSIS — Z8349 Family history of other endocrine, nutritional and metabolic diseases: Secondary | ICD-10-CM

## 2022-04-14 ENCOUNTER — Other Ambulatory Visit: Payer: Self-pay

## 2022-04-14 ENCOUNTER — Ambulatory Visit
Admission: RE | Admit: 2022-04-14 | Discharge: 2022-04-14 | Disposition: A | Discharge status: Home / Self Care | Payer: Managed Care, Other (non HMO) | Source: Ambulatory Visit | Attending: Internal Medicine | Admitting: Internal Medicine

## 2022-04-14 DIAGNOSIS — R946 Abnormal results of thyroid function studies: Secondary | ICD-10-CM | POA: Diagnosis present

## 2022-04-14 DIAGNOSIS — Z8349 Family history of other endocrine, nutritional and metabolic diseases: Secondary | ICD-10-CM

## 2022-04-14 DIAGNOSIS — E049 Nontoxic goiter, unspecified: Secondary | ICD-10-CM

## 2022-05-12 ENCOUNTER — Telehealth: Payer: Self-pay | Admitting: Obstetrics and Gynecology

## 2022-05-12 NOTE — Telephone Encounter (Signed)
Pt called stating that she recently had an ectopic pregnancy, and was told if she has another positive test to reach out, pt is asking what her next steps are. Please advise.  ?

## 2022-05-14 NOTE — Telephone Encounter (Signed)
Spoke with patient. She is going to come in Friday for confirmation and further lab work if appropriate.  ?

## 2022-05-16 ENCOUNTER — Ambulatory Visit (INDEPENDENT_AMBULATORY_CARE_PROVIDER_SITE_OTHER): Payer: Managed Care, Other (non HMO) | Admitting: Obstetrics and Gynecology

## 2022-05-16 ENCOUNTER — Encounter: Payer: Self-pay | Admitting: Obstetrics and Gynecology

## 2022-05-16 DIAGNOSIS — O209 Hemorrhage in early pregnancy, unspecified: Secondary | ICD-10-CM

## 2022-05-16 DIAGNOSIS — Z3200 Encounter for pregnancy test, result unknown: Secondary | ICD-10-CM | POA: Diagnosis not present

## 2022-05-16 LAB — POCT URINE PREGNANCY: Preg Test, Ur: POSITIVE — AB

## 2022-05-16 NOTE — Progress Notes (Signed)
Patient presents today for pregnancy confirmation. UPT tested positive. Patient stated having a positive home pregnancy test last weekend and on the same day began to bleed. She states today she is no longer bleeding, stopped Tuesday with no tissue passing or clotting. Advised patient we will be in touch once Beta values come back. No other questions or concerns at this time

## 2022-05-17 LAB — BETA HCG QUANT (REF LAB): hCG Quant: 116 m[IU]/mL

## 2022-05-18 NOTE — Progress Notes (Signed)
Needs FU Quant now. Once quant above 1200 then needs [place]

## 2022-05-19 ENCOUNTER — Other Ambulatory Visit: Payer: Self-pay

## 2022-05-19 ENCOUNTER — Other Ambulatory Visit: Payer: Managed Care, Other (non HMO)

## 2022-05-19 DIAGNOSIS — O209 Hemorrhage in early pregnancy, unspecified: Secondary | ICD-10-CM

## 2022-05-20 ENCOUNTER — Other Ambulatory Visit: Payer: Self-pay

## 2022-05-20 ENCOUNTER — Ambulatory Visit: Payer: Managed Care, Other (non HMO)

## 2022-05-20 DIAGNOSIS — O209 Hemorrhage in early pregnancy, unspecified: Secondary | ICD-10-CM

## 2022-05-20 LAB — BETA HCG QUANT (REF LAB): hCG Quant: 141 m[IU]/mL

## 2022-05-20 NOTE — Progress Notes (Signed)
Per Dr. Logan Bores due to Beta levels. Ultrasound to rule out ectopic.

## 2022-05-21 ENCOUNTER — Ambulatory Visit (INDEPENDENT_AMBULATORY_CARE_PROVIDER_SITE_OTHER): Payer: Managed Care, Other (non HMO)

## 2022-05-21 ENCOUNTER — Other Ambulatory Visit: Payer: Self-pay

## 2022-05-21 DIAGNOSIS — O209 Hemorrhage in early pregnancy, unspecified: Secondary | ICD-10-CM

## 2022-05-21 LAB — BETA HCG QUANT (REF LAB): hCG Quant: 81 m[IU]/mL

## 2022-05-21 NOTE — Progress Notes (Signed)
Per Dr.Evans due to decreasing Beta levels and recent ultrasound. Patient made aware and scheduled.

## 2022-05-23 ENCOUNTER — Other Ambulatory Visit: Payer: Managed Care, Other (non HMO)

## 2022-05-24 LAB — BETA HCG QUANT (REF LAB): hCG Quant: 64 m[IU]/mL

## 2022-05-28 ENCOUNTER — Other Ambulatory Visit: Payer: Self-pay

## 2022-05-28 DIAGNOSIS — Z8759 Personal history of other complications of pregnancy, childbirth and the puerperium: Secondary | ICD-10-CM

## 2022-05-28 DIAGNOSIS — O209 Hemorrhage in early pregnancy, unspecified: Secondary | ICD-10-CM

## 2022-05-29 ENCOUNTER — Other Ambulatory Visit: Payer: Managed Care, Other (non HMO)

## 2022-05-29 DIAGNOSIS — Z8759 Personal history of other complications of pregnancy, childbirth and the puerperium: Secondary | ICD-10-CM

## 2022-05-29 DIAGNOSIS — O209 Hemorrhage in early pregnancy, unspecified: Secondary | ICD-10-CM

## 2022-05-30 LAB — BETA HCG QUANT (REF LAB): hCG Quant: 6 m[IU]/mL

## 2022-06-10 ENCOUNTER — Encounter: Payer: Managed Care, Other (non HMO) | Admitting: Obstetrics and Gynecology

## 2022-06-10 DIAGNOSIS — O039 Complete or unspecified spontaneous abortion without complication: Secondary | ICD-10-CM

## 2022-06-10 DIAGNOSIS — Z8759 Personal history of other complications of pregnancy, childbirth and the puerperium: Secondary | ICD-10-CM

## 2022-07-11 ENCOUNTER — Ambulatory Visit (INDEPENDENT_AMBULATORY_CARE_PROVIDER_SITE_OTHER): Payer: Managed Care, Other (non HMO) | Admitting: Obstetrics and Gynecology

## 2022-07-11 VITALS — Ht 65.0 in | Wt 173.0 lb

## 2022-07-11 DIAGNOSIS — Z3200 Encounter for pregnancy test, result unknown: Secondary | ICD-10-CM | POA: Diagnosis not present

## 2022-07-11 LAB — POCT URINE PREGNANCY: Preg Test, Ur: POSITIVE — AB

## 2022-07-11 NOTE — Progress Notes (Signed)
Pt presents for pregnancy confirmation done on 07/11/22, UPT POSITIVE. [id number]. Pt states she is/not currently taking PNV. EDD 03/06/23 based on LMP of 05/30/22. [redacted]w[redacted]d. Pt to schedule f/u nurse visit, labs, etc. with front desk at checkout. All questions answered.

## 2022-07-29 ENCOUNTER — Other Ambulatory Visit (INDEPENDENT_AMBULATORY_CARE_PROVIDER_SITE_OTHER): Payer: Managed Care, Other (non HMO)

## 2022-07-29 ENCOUNTER — Other Ambulatory Visit: Payer: Self-pay

## 2022-07-29 DIAGNOSIS — Z3200 Encounter for pregnancy test, result unknown: Secondary | ICD-10-CM

## 2022-08-07 ENCOUNTER — Ambulatory Visit: Payer: Managed Care, Other (non HMO)

## 2022-08-07 VITALS — Wt 165.0 lb

## 2022-08-07 DIAGNOSIS — Z3480 Encounter for supervision of other normal pregnancy, unspecified trimester: Secondary | ICD-10-CM

## 2022-08-07 DIAGNOSIS — Z369 Encounter for antenatal screening, unspecified: Secondary | ICD-10-CM

## 2022-08-07 DIAGNOSIS — Z3401 Encounter for supervision of normal first pregnancy, first trimester: Secondary | ICD-10-CM

## 2022-08-07 DIAGNOSIS — Z3689 Encounter for other specified antenatal screening: Secondary | ICD-10-CM

## 2022-08-07 HISTORY — DX: Encounter for supervision of other normal pregnancy, unspecified trimester: Z34.80

## 2022-08-07 NOTE — Progress Notes (Signed)
New OB Intake  I connected with  Linda Madden on 08/07/22 at  8:15 AM EDT by telephone and verified that I am speaking with the correct person using two identifiers. Nurse is located at [facility] and pt is located at home.  I explained I am completing New OB Intake today. We discussed her EDD of 03/06/2023 that is based on LMP of 05/30/2022. Pt is G3/P0020. I reviewed her allergies, medications, Medical/Surgical/OB history, and appropriate screenings. Based on history, this is a/an pregnancy uncomplicated .   There are no problems to display for this patient. Two loses this year.  Concerns addressed today None  Delivery Plans:  Plans to deliver at [facility] - undecided; adv if delivers elsewhere will have someone deliver her that she doesn't know.  Anatomy US Explained first scheduled [place] will be around 20 weeks.   Labs Discussed genetic screening with patient. Patient desires genetic testing to be drawn at some point. Discussed possible labs to be drawn at new OB appointment.  COVID Vaccine Patient has had COVID vaccine.   Social Determinants of Health Food Insecurity: denies food insecurity Transportation: Patient denies transportation needs.  First visit review I reviewed new OB appt with pt. I explained she will have ob bloodwork and pap smear/pelvic exam if indicated. Explained pt will be seen by Dr. Brennan Madden at first visit; encounter routed to appropriate provider.   Linda Madden, [facility] 08/07/2022  8:44 AM  Clinical Staff Provider  Office Location  [facility] Dating    Language  English Anatomy US    Flu Vaccine  offer Genetic Screen  NIPS:   TDaP vaccine   offer Hgb A1C or  GTT Early : Third trimester :   Covid UTD   LAB RESULTS   Rhogam   Blood Type --/--/B NEG (02/17 1423)   Feeding Plan breast Antibody NEG (02/17 1423)  Contraception none Rubella    Circumcision yes RPR     Pediatrician  undecided HBsAg     Support  Person Linda Madden HIV    Prenatal Classes yes Varicella     GBS  (For PCN allergy, check sensitivities)   BTL Consent  Hep C   VBAC Consent  Pap      Hgb Electro      CF      SMA

## 2022-08-08 ENCOUNTER — Other Ambulatory Visit: Payer: Managed Care, Other (non HMO)

## 2022-08-08 DIAGNOSIS — Z3401 Encounter for supervision of normal first pregnancy, first trimester: Secondary | ICD-10-CM

## 2022-08-08 DIAGNOSIS — Z369 Encounter for antenatal screening, unspecified: Secondary | ICD-10-CM

## 2022-08-09 LAB — URINALYSIS, ROUTINE W REFLEX MICROSCOPIC
Bilirubin, UA: NEGATIVE
Glucose, UA: NEGATIVE
Ketones, UA: NEGATIVE
Leukocytes,UA: NEGATIVE
Nitrite, UA: NEGATIVE
RBC, UA: NEGATIVE
Specific Gravity, UA: 1.026 (ref 1.005–1.030)
Urobilinogen, Ur: 0.2 mg/dL (ref 0.2–1.0)
pH, UA: 8.5 — ABNORMAL HIGH (ref 5.0–7.5)

## 2022-08-09 LAB — CBC/D/PLT+RPR+RH+ABO+RUBIGG...
Antibody Screen: NEGATIVE
Basophils Absolute: 0 10*3/uL (ref 0.0–0.2)
Basos: 0 %
EOS (ABSOLUTE): 0.1 10*3/uL (ref 0.0–0.4)
Eos: 1 %
HCV Ab: NONREACTIVE
HIV Screen 4th Generation wRfx: NONREACTIVE
Hematocrit: 38.4 % (ref 34.0–46.6)
Hemoglobin: 12.6 g/dL (ref 11.1–15.9)
Hepatitis B Surface Ag: NEGATIVE
Immature Grans (Abs): 0 10*3/uL (ref 0.0–0.1)
Immature Granulocytes: 0 %
Lymphocytes Absolute: 1.5 10*3/uL (ref 0.7–3.1)
Lymphs: 16 %
MCH: 27.9 pg (ref 26.6–33.0)
MCHC: 32.8 g/dL (ref 31.5–35.7)
MCV: 85 fL (ref 79–97)
Monocytes Absolute: 0.6 10*3/uL (ref 0.1–0.9)
Monocytes: 6 %
Neutrophils Absolute: 6.9 10*3/uL (ref 1.4–7.0)
Neutrophils: 77 %
Platelets: 281 10*3/uL (ref 150–450)
RBC: 4.52 x10E6/uL (ref 3.77–5.28)
RDW: 13.8 % (ref 11.7–15.4)
RPR Ser Ql: NONREACTIVE
Rh Factor: NEGATIVE
Rubella Antibodies, IGG: 1.62 index (ref >0.99)
Varicella zoster IgG: <135 index — ABNORMAL LOW (ref >165)
WBC: 9.2 10*3/uL (ref 3.4–10.8)

## 2022-08-09 LAB — MICROSCOPIC EXAMINATION
Bacteria, UA: NONE SEEN
Casts: NONE SEEN /lpf
RBC, Urine: NONE SEEN /hpf (ref 0–2)
WBC, UA: NONE SEEN /hpf (ref 0–5)

## 2022-08-09 LAB — HCV INTERPRETATION

## 2022-08-11 LAB — NICOTINE SCREEN, URINE: Cotinine Ql Scrn, Ur: NEGATIVE ng/mL

## 2022-08-11 LAB — URINE CULTURE, OB REFLEX

## 2022-08-11 LAB — GC/CHLAMYDIA PROBE AMP
Chlamydia trachomatis, NAA: NEGATIVE
Neisseria Gonorrhoeae by PCR: NEGATIVE

## 2022-08-11 LAB — CULTURE, OB URINE

## 2022-08-12 ENCOUNTER — Encounter: Payer: Self-pay | Admitting: Obstetrics and Gynecology

## 2022-08-14 ENCOUNTER — Encounter: Payer: Managed Care, Other (non HMO) | Admitting: Obstetrics and Gynecology

## 2022-08-16 ENCOUNTER — Other Ambulatory Visit: Payer: Self-pay

## 2022-08-16 ENCOUNTER — Emergency Department: Payer: Managed Care, Other (non HMO)

## 2022-08-16 ENCOUNTER — Emergency Department
Admission: EM | Admit: 2022-08-16 | Discharge: 2022-08-16 | Disposition: A | Discharge status: Home / Self Care | Payer: Managed Care, Other (non HMO) | Attending: Emergency Medicine | Admitting: Emergency Medicine

## 2022-08-16 DIAGNOSIS — Z3A11 11 weeks gestation of pregnancy: Secondary | ICD-10-CM | POA: Diagnosis not present

## 2022-08-16 DIAGNOSIS — O99281 Endocrine, nutritional and metabolic diseases complicating pregnancy, first trimester: Secondary | ICD-10-CM | POA: Insufficient documentation

## 2022-08-16 DIAGNOSIS — Z6721 Type B blood, Rh negative: Secondary | ICD-10-CM | POA: Insufficient documentation

## 2022-08-16 DIAGNOSIS — E876 Hypokalemia: Secondary | ICD-10-CM | POA: Diagnosis not present

## 2022-08-16 DIAGNOSIS — O200 Threatened abortion: Secondary | ICD-10-CM | POA: Insufficient documentation

## 2022-08-16 DIAGNOSIS — O4690 Antepartum hemorrhage, unspecified, unspecified trimester: Secondary | ICD-10-CM

## 2022-08-16 DIAGNOSIS — Z2913 Encounter for prophylactic Rho(D) immune globulin: Secondary | ICD-10-CM | POA: Diagnosis not present

## 2022-08-16 DIAGNOSIS — O209 Hemorrhage in early pregnancy, unspecified: Secondary | ICD-10-CM | POA: Diagnosis present

## 2022-08-16 LAB — CBC WITH DIFFERENTIAL/PLATELET
Abs Immature Granulocytes: 0.04 10*3/uL (ref 0.00–0.07)
Basophils Absolute: 0 10*3/uL (ref 0.0–0.1)
Basophils Relative: 0 %
Eosinophils Absolute: 0.1 10*3/uL (ref 0.0–0.5)
Eosinophils Relative: 1 %
HCT: 36 % (ref 36.0–46.0)
Hemoglobin: 12 g/dL (ref 12.0–15.0)
Immature Granulocytes: 1 %
Lymphocytes Relative: 14 %
Lymphs Abs: 1.2 10*3/uL (ref 0.7–4.0)
MCH: 27.9 pg (ref 26.0–34.0)
MCHC: 33.3 g/dL (ref 30.0–36.0)
MCV: 83.7 fL (ref 80.0–100.0)
Monocytes Absolute: 0.6 10*3/uL (ref 0.1–1.0)
Monocytes Relative: 7 %
Neutro Abs: 6.6 10*3/uL (ref 1.7–7.7)
Neutrophils Relative %: 77 %
Platelets: 250 10*3/uL (ref 150–400)
RBC: 4.3 MIL/uL (ref 3.87–5.11)
RDW: 13.5 % (ref 11.5–15.5)
WBC: 8.5 10*3/uL (ref 4.0–10.5)
nRBC: 0 % (ref 0.0–0.2)

## 2022-08-16 LAB — URINALYSIS, ROUTINE W REFLEX MICROSCOPIC
Bilirubin Urine: NEGATIVE
Glucose, UA: NEGATIVE mg/dL
Ketones, ur: NEGATIVE mg/dL
Leukocytes,Ua: NEGATIVE
Nitrite: NEGATIVE
Protein, ur: NEGATIVE mg/dL
Specific Gravity, Urine: 1.015 (ref 1.005–1.030)
pH: 7 (ref 5.0–8.0)

## 2022-08-16 LAB — WET PREP, GENITAL
Clue Cells Wet Prep HPF POC: NONE SEEN
Sperm: NONE SEEN
Trich, Wet Prep: NONE SEEN
WBC, Wet Prep HPF POC: <10 (ref <10)
Yeast Wet Prep HPF POC: NONE SEEN

## 2022-08-16 LAB — BASIC METABOLIC PANEL
Anion gap: 5 (ref 5–15)
BUN: 10 mg/dL (ref 6–20)
CO2: 25 mmol/L (ref 22–32)
Calcium: 9.3 mg/dL (ref 8.9–10.3)
Chloride: 106 mmol/L (ref 98–111)
Creatinine, Ser: 0.53 mg/dL (ref 0.44–1.00)
GFR, Estimated: >60 mL/min (ref >60)
Glucose, Bld: 103 mg/dL — ABNORMAL HIGH (ref 70–99)
Potassium: 3.3 mmol/L — ABNORMAL LOW (ref 3.5–5.1)
Sodium: 136 mmol/L (ref 135–145)

## 2022-08-16 LAB — ANTIBODY SCREEN: Antibody Screen: NEGATIVE

## 2022-08-16 LAB — ABO/RH: ABO/RH(D): B NEG

## 2022-08-16 LAB — CHLAMYDIA/NGC RT PCR (ARMC ONLY)
Chlamydia Tr: NOT DETECTED
N gonorrhoeae: NOT DETECTED

## 2022-08-16 LAB — HCG, QUANTITATIVE, PREGNANCY: hCG, Beta Chain, Quant, S: 78137 m[IU]/mL — ABNORMAL HIGH (ref <5)

## 2022-08-16 MED ORDER — POTASSIUM CHLORIDE CRYS ER 20 MEQ PO TBCR
40.0000 meq | EXTENDED_RELEASE_TABLET | Freq: Once | ORAL | Status: AC
  Administered 2022-08-16: 40 meq via ORAL
  Filled 2022-08-16: qty 2

## 2022-08-16 MED ORDER — RHO D IMMUNE GLOBULIN 1500 UNIT/2ML IJ SOSY
300.0000 ug | PREFILLED_SYRINGE | Freq: Once | INTRAMUSCULAR | Status: AC
  Administered 2022-08-16: 300 ug via INTRAMUSCULAR
  Filled 2022-08-16: qty 2

## 2022-08-16 NOTE — ED Triage Notes (Signed)
Pt via POV from home. Pt c/o abd cramping and vaginal bleeding that started last night. States the pain stated this AM. Pt is 10 weeks and pregnancy has been confirmed by a doctor. Pt has a hx of miscarriage and ectopic pregnancy. Pt is A&Ox4 and NAD

## 2022-08-16 NOTE — ED Provider Notes (Signed)
[facility] Provider Note    Event Date/Time   First MD Initiated Contact with Patient 08/16/22 1029     (approximate)   History   Chief Complaint Vaginal Bleeding   HPI  Linda Madden is a 31 y.o. female G3P0020 at approximately 10 weeks of pregnancy who presents to the ED complaining of vaginal bleeding.  Patient reports that she developed bright red vaginal bleeding yesterday afternoon and evening.  She describes this as relatively mild and similar to spotting, has not had to use a full pad.  She states that the blood has seemed darker today and has seemed to ease up, however she has developed crampy pain in the center of her lower abdomen moving towards the left side.  Pain is also described as mild, present constantly and not exacerbated or alleviated by anything in particular.  She denies any dysuria and has not had any vaginal discharge.  She denies any fevers, flank pain, nausea, or vomiting.  She reports having ultrasound earlier this pregnancy that was unremarkable, also reports history of ectopic pregnancy and miscarriage in the past.      Physical Exam   Triage Vital Signs: ED Triage Vitals  Enc Vitals Group     BP 08/16/22 1013 133/87     Pulse Rate 08/16/22 1013 98     Resp 08/16/22 1013 20     Temp --      Temp src --      SpO2 08/16/22 1013 98 %     Weight 08/16/22 1013 163 lb (73.9 kg)     Height 08/16/22 1013 5\' 5"  (1.651 m)     Head Circumference --      Peak Flow --      Pain Score 08/16/22 1012 3     Pain Loc --      Pain Edu? --      Excl. in [place]? --     Most recent vital signs: Vitals:   08/16/22 1013 08/16/22 1311  BP: 133/87 113/76  Pulse: 98 74  Resp: 20 18  SpO2: 98% 100%    Constitutional: Alert and oriented. Eyes: Conjunctivae are normal. Head: Atraumatic. Nose: No congestion/rhinnorhea. Mouth/Throat: Mucous membranes are moist.  Cardiovascular: Normal rate, regular rhythm. Grossly normal heart sounds.   2+ radial pulses bilaterally. Respiratory: Normal respiratory effort.  No retractions. Lungs CTAB. Gastrointestinal: Soft and nontender. No distention. Genitourinary: Dark blood in the vaginal vault noted with no active bleeding, cervical os closed with no cervical motion adnexal tenderness. Musculoskeletal: No lower extremity tenderness nor edema.  Neurologic:  Normal speech and language. No gross focal neurologic deficits are appreciated.    ED Results / Procedures / Treatments   Labs (all labs ordered are listed, but only abnormal results are displayed) Labs Reviewed  BASIC METABOLIC PANEL - Abnormal; Notable for the following components:      Result Value   Potassium 3.3 (*)    Glucose, Bld 103 (*)    All other components within normal limits  HCG, QUANTITATIVE, PREGNANCY - Abnormal; Notable for the following components:   hCG, Beta Chain, Quant, S 78,137 (*)    All other components within normal limits  URINALYSIS, ROUTINE W REFLEX MICROSCOPIC - Abnormal; Notable for the following components:   Color, Urine YELLOW (*)    APPearance CLOUDY (*)    Hgb urine dipstick SMALL (*)    Bacteria, UA MANY (*)    All other components within normal limits  CHLAMYDIA/NGC RT PCR ([facility] ONLY)            WET PREP, GENITAL  CBC WITH DIFFERENTIAL/PLATELET  ABO/RH  ANTIBODY SCREEN  RHOGAM INJECTION    RADIOLOGY Obstetric ultrasound reviewed and interpreted by me with intrauterine pregnancy and appropriate fetal heart rate noted.  PROCEDURES:  Critical Care performed: No  Procedures   MEDICATIONS ORDERED IN ED: Medications  rho (d) immune globulin (RHIG/RHOPHYLAC) injection 300 mcg (300 mcg Intramuscular Given 08/16/22 1306)  potassium chloride SA (KLOR-CON M) CR tablet 40 mEq (40 mEq Oral Given 08/16/22 1306)     IMPRESSION / MDM / ASSESSMENT AND PLAN / ED COURSE  I reviewed the triage vital signs and the nursing notes.                              31 y.o. female G3P0020 at  approximately 10 weeks of pregnancy who presents to the ED complaining of light vaginal bleeding since yesterday with crampy pain in her lower abdomen earlier today.  Patient's presentation is most consistent with acute presentation with potential threat to life or bodily function.  Differential diagnosis includes, but is not limited to, ectopic pregnancy, miscarriage, threatened miscarriage, anemia, need for RhoGAM.  Patient nontoxic-appearing and in no acute distress, vital signs are reassuring.  Her abdominal exam is benign with no tenderness, pelvic exam with dark older blood but no active bleeding noted, cervical os closed with no tenderness.  She reports prior ultrasound showing intrauterine pregnancy, however this result does not seem to be in our system or care everywhere.  We will repeat ultrasound to assess for ectopic pregnancy or miscarriage, also screen labs including CBC, BMP, and hCG levels.  She is Rh- and will require dose of RhoGAM here in the ED.  Labs are reassuring without significant leukocytosis or anemia, electrolytes remarkable for mild hypokalemia which was repleted.  Beta hCG is appropriately elevated, urinalysis shows no signs of infection.  Patient given IM RhoGAM and ultrasound is reassuring with intrauterine pregnancy and appropriate fetal heart rate.  Patient is appropriate for discharge home with OB/GYN follow-up, was counseled to return to the ED for new or worsening symptoms.  Patient agrees with plan.      FINAL CLINICAL IMPRESSION(S) / ED DIAGNOSES   Final diagnoses:  Vaginal bleeding in pregnancy  Threatened miscarriage     Rx / DC Orders   ED Discharge Orders     None        Note:  This document was prepared using Dragon voice recognition software and may include unintentional dictation errors.   Chesley Noon, MD 08/16/22 [phone]

## 2022-08-17 LAB — RHOGAM INJECTION: Unit division: 0

## 2022-08-18 ENCOUNTER — Other Ambulatory Visit: Payer: Managed Care, Other (non HMO)

## 2022-08-18 ENCOUNTER — Telehealth: Payer: Self-pay | Admitting: Obstetrics and Gynecology

## 2022-08-18 NOTE — Telephone Encounter (Signed)
Patient states she has had some bleeding on Friday. She went to the ER and labeled it as a threatened miscarriage. Patient states the bleeding was red in color and very light. Patient states bleeding then turned brown on Saturday and has stopped since. Please advise.

## 2022-08-18 NOTE — Telephone Encounter (Signed)
Spoke with patient. She stated on Friday she began to have red spotting which on Saturday turned to brown bleeding and cramping. Patient went to ED where an ultrasound was done, dates and sizing were normal. She states since then she has had no bleeding or cramping. Advised patient to take it easy and to call the office if cramping or bleeding were to start again. Patient voiced understanding and no other concerns at this time.

## 2022-08-25 LAB — MATERNIT 21 PLUS CORE, BLOOD
Fetal Fraction: 5
Result (T21): NEGATIVE
Trisomy 13 (Patau syndrome): NEGATIVE
Trisomy 18 (Edwards syndrome): NEGATIVE
Trisomy 21 (Down syndrome): NEGATIVE

## 2022-08-27 ENCOUNTER — Other Ambulatory Visit (HOSPITAL_COMMUNITY)
Admission: RE | Admit: 2022-08-27 | Discharge: 2022-08-27 | Disposition: A | Discharge status: Home / Self Care | Payer: Managed Care, Other (non HMO) | Source: Ambulatory Visit | Attending: Obstetrics and Gynecology | Admitting: Obstetrics and Gynecology

## 2022-08-27 ENCOUNTER — Ambulatory Visit (INDEPENDENT_AMBULATORY_CARE_PROVIDER_SITE_OTHER): Payer: Managed Care, Other (non HMO) | Admitting: Obstetrics and Gynecology

## 2022-08-27 ENCOUNTER — Encounter: Payer: Self-pay | Admitting: Obstetrics and Gynecology

## 2022-08-27 VITALS — BP 119/85 | HR 85 | Wt 168.6 lb

## 2022-08-27 DIAGNOSIS — Z3481 Encounter for supervision of other normal pregnancy, first trimester: Secondary | ICD-10-CM | POA: Insufficient documentation

## 2022-08-27 DIAGNOSIS — Z124 Encounter for screening for malignant neoplasm of cervix: Secondary | ICD-10-CM

## 2022-08-27 DIAGNOSIS — Z3401 Encounter for supervision of normal first pregnancy, first trimester: Secondary | ICD-10-CM

## 2022-08-27 DIAGNOSIS — Z3A12 12 weeks gestation of pregnancy: Secondary | ICD-10-CM

## 2022-08-27 DIAGNOSIS — Z1379 Encounter for other screening for genetic and chromosomal anomalies: Secondary | ICD-10-CM

## 2022-08-27 LAB — POCT URINALYSIS DIPSTICK OB
Bilirubin, UA: NEGATIVE
Blood, UA: NEGATIVE
Glucose, UA: NEGATIVE
Ketones, UA: NEGATIVE
Leukocytes, UA: NEGATIVE
Nitrite, UA: NEGATIVE
POC,PROTEIN,UA: NEGATIVE
Spec Grav, UA: 1.015 (ref 1.010–1.025)
Urobilinogen, UA: 0.2 E.U./dL
pH, UA: 7 (ref 5.0–8.0)

## 2022-08-27 NOTE — Progress Notes (Signed)
NOB: No complaints.  Ultrasounds reviewed.  Patient reports no further bleeding.  aFP next visit.  Pap performed today.  Physical examination General NAD, Conversant  HEENT Atraumatic; Op clear with mmm.  Normo-cephalic. Pupils reactive. Anicteric sclerae  Thyroid/Neck Smooth without nodularity or enlargement. Normal ROM.  Neck Supple.  Skin No rashes, lesions or ulceration. Normal palpated skin turgor. No nodularity.  Breasts: No masses or discharge.  Symmetric.  No axillary adenopathy.  Lungs: Clear to auscultation.No rales or wheezes. Normal Respiratory effort, no retractions.  Heart: NSR.  No murmurs or rubs appreciated. No periferal edema  Abdomen: Soft.  Non-tender.  No masses.  No HSM. No hernia  Extremities: Moves all appropriately.  Normal ROM for age. No lymphadenopathy.  Neuro: Oriented to PPT.  Normal mood. Normal affect.     Pelvic:   Vulva: Normal appearance.  No lesions.  Vagina: No lesions or abnormalities noted.  Support: Normal pelvic support.  Urethra No masses tenderness or scarring.  Meatus Normal size without lesions or prolapse.  Cervix: Normal appearance.  No lesions.  Anus: Normal exam.  No lesions.  Perineum: Normal exam.  No lesions.        Bimanual   Adnexae: No masses.  Non-tender to palpation.  Uterus: Enlarged. 12wks  152 bpm  Non-tender.  Mobile.  AV.  Adnexae: No masses.  Non-tender to palpation.  Cul-de-sac: Negative for abnormality.  Adnexae: No masses.  Non-tender to palpation.         Pelvimetry   Diagonal: Reached.  Spines: Average.  Sacrum: Concave.  Pubic Arch: Normal.

## 2022-08-27 NOTE — Progress Notes (Signed)
Patient presents today for New OB physical. She states no nausea or vomiting but she is experiencing  congestion. Patient is due for her pap smear. She completed genetic testing, Materniti21 negative. Patient states no other questions or concerns at this time.

## 2022-08-28 ENCOUNTER — Encounter: Payer: Self-pay | Admitting: Obstetrics and Gynecology

## 2022-09-02 LAB — CYTOLOGY - PAP: Diagnosis: NEGATIVE

## 2022-09-03 ENCOUNTER — Emergency Department: Payer: Managed Care, Other (non HMO)

## 2022-09-03 ENCOUNTER — Emergency Department
Admission: EM | Admit: 2022-09-03 | Discharge: 2022-09-03 | Disposition: A | Discharge status: Home / Self Care | Payer: Managed Care, Other (non HMO) | Attending: Emergency Medicine | Admitting: Emergency Medicine

## 2022-09-03 ENCOUNTER — Encounter: Payer: Self-pay | Admitting: Emergency Medicine

## 2022-09-03 ENCOUNTER — Other Ambulatory Visit: Payer: Self-pay

## 2022-09-03 ENCOUNTER — Encounter: Payer: Self-pay | Admitting: Obstetrics and Gynecology

## 2022-09-03 DIAGNOSIS — O200 Threatened abortion: Secondary | ICD-10-CM | POA: Insufficient documentation

## 2022-09-03 DIAGNOSIS — O209 Hemorrhage in early pregnancy, unspecified: Secondary | ICD-10-CM | POA: Diagnosis present

## 2022-09-03 DIAGNOSIS — Z3A13 13 weeks gestation of pregnancy: Secondary | ICD-10-CM | POA: Insufficient documentation

## 2022-09-03 LAB — URINALYSIS, ROUTINE W REFLEX MICROSCOPIC
Bilirubin Urine: NEGATIVE
Glucose, UA: NEGATIVE mg/dL
Ketones, ur: NEGATIVE mg/dL
Leukocytes,Ua: NEGATIVE
Nitrite: NEGATIVE
Protein, ur: NEGATIVE mg/dL
Specific Gravity, Urine: 1.013 (ref 1.005–1.030)
pH: 7 (ref 5.0–8.0)

## 2022-09-03 LAB — CBC WITH DIFFERENTIAL/PLATELET
Abs Immature Granulocytes: 0.05 10*3/uL (ref 0.00–0.07)
Basophils Absolute: 0 10*3/uL (ref 0.0–0.1)
Basophils Relative: 0 %
Eosinophils Absolute: 0.1 10*3/uL (ref 0.0–0.5)
Eosinophils Relative: 1 %
HCT: 34.6 % — ABNORMAL LOW (ref 36.0–46.0)
Hemoglobin: 11.7 g/dL — ABNORMAL LOW (ref 12.0–15.0)
Immature Granulocytes: 0 %
Lymphocytes Relative: 13 %
Lymphs Abs: 1.6 10*3/uL (ref 0.7–4.0)
MCH: 28.3 pg (ref 26.0–34.0)
MCHC: 33.8 g/dL (ref 30.0–36.0)
MCV: 83.8 fL (ref 80.0–100.0)
Monocytes Absolute: 0.7 10*3/uL (ref 0.1–1.0)
Monocytes Relative: 6 %
Neutro Abs: 9.3 10*3/uL — ABNORMAL HIGH (ref 1.7–7.7)
Neutrophils Relative %: 80 %
Platelets: 291 10*3/uL (ref 150–400)
RBC: 4.13 MIL/uL (ref 3.87–5.11)
RDW: 13.7 % (ref 11.5–15.5)
WBC: 11.8 10*3/uL — ABNORMAL HIGH (ref 4.0–10.5)
nRBC: 0 % (ref 0.0–0.2)

## 2022-09-03 LAB — ANTIBODY SCREEN: Antibody Screen: POSITIVE

## 2022-09-03 LAB — HCG, QUANTITATIVE, PREGNANCY: hCG, Beta Chain, Quant, S: 67760 m[IU]/mL — ABNORMAL HIGH (ref <5)

## 2022-09-03 LAB — COMPREHENSIVE METABOLIC PANEL
ALT: 31 U/L (ref 0–44)
AST: 25 U/L (ref 15–41)
Albumin: 3.8 g/dL (ref 3.5–5.0)
Alkaline Phosphatase: 44 U/L (ref 38–126)
Anion gap: 7 (ref 5–15)
BUN: 9 mg/dL (ref 6–20)
CO2: 22 mmol/L (ref 22–32)
Calcium: 9.4 mg/dL (ref 8.9–10.3)
Chloride: 105 mmol/L (ref 98–111)
Creatinine, Ser: 0.37 mg/dL — ABNORMAL LOW (ref 0.44–1.00)
GFR, Estimated: >60 mL/min (ref >60)
Glucose, Bld: 106 mg/dL — ABNORMAL HIGH (ref 70–99)
Potassium: 3.5 mmol/L (ref 3.5–5.1)
Sodium: 134 mmol/L — ABNORMAL LOW (ref 135–145)
Total Bilirubin: 0.4 mg/dL (ref 0.3–1.2)
Total Protein: 7.9 g/dL (ref 6.5–8.1)

## 2022-09-03 LAB — ABO/RH: ABO/RH(D): B NEG

## 2022-09-03 MED ORDER — RHO D IMMUNE GLOBULIN 1500 UNIT/2ML IJ SOSY
300.0000 ug | PREFILLED_SYRINGE | Freq: Once | INTRAMUSCULAR | Status: AC
  Administered 2022-09-03: 300 ug via INTRAMUSCULAR
  Filled 2022-09-03: qty 2

## 2022-09-03 NOTE — ED Provider Notes (Signed)
[facility] Provider Note    Event Date/Time   First MD Initiated Contact with Patient 09/03/22 1715     (approximate)   History   Chief Complaint Vaginal Bleeding   HPI  Linda Madden is a 31 y.o. female, G3P0020 at approximately 13 weeks of pregnancy who presents to the ED complaining of vaginal bleeding.  Patient reports that she developed relatively light vaginal bleeding around midnight last night.  She describes it as consistent but not enough to fill a pad, she primarily noticed it when she went to wipe.  Bleeding has eased off over the course of the day today, she has not passed any clots and denies any discharge.  She has had some mild crampy pain in her pelvic area, otherwise denies any pain.  She has not had any dysuria, fever, flank pain, nausea, or vomiting.  She follows with Dr. Logan Bores of OB/GYN.      Physical Exam   Triage Vital Signs: ED Triage Vitals  Enc Vitals Group     BP 09/03/22 1556 130/81     Pulse Rate 09/03/22 1556 95     Resp 09/03/22 1556 16     Temp 09/03/22 1556 98.5 F (36.9 C)     Temp src --      SpO2 09/03/22 1556 97 %     Weight 09/03/22 1555 166 lb (75.3 kg)     Height 09/03/22 1555 5\' 5"  (1.651 m)     Head Circumference --      Peak Flow --      Pain Score 09/03/22 1555 2     Pain Loc --      Pain Edu? --      Excl. in GC? --     Most recent vital signs: Vitals:   09/03/22 1556  BP: 130/81  Pulse: 95  Resp: 16  Temp: 98.5 F (36.9 C)  SpO2: 97%    Constitutional: Alert and oriented. Eyes: Conjunctivae are normal. Head: Atraumatic. Nose: No congestion/rhinnorhea. Mouth/Throat: Mucous membranes are moist.  Cardiovascular: Normal rate, regular rhythm. Grossly normal heart sounds.  2+ radial pulses bilaterally. Respiratory: Normal respiratory effort.  No retractions. Lungs CTAB. Gastrointestinal: Soft and nontender. No distention. Musculoskeletal: No lower extremity tenderness nor edema.   Neurologic:  Normal speech and language. No gross focal neurologic deficits are appreciated.    ED Results / Procedures / Treatments   Labs (all labs ordered are listed, but only abnormal results are displayed) Labs Reviewed  URINALYSIS, ROUTINE W REFLEX MICROSCOPIC - Abnormal; Notable for the following components:      Result Value   Color, Urine YELLOW (*)    APPearance CLEAR (*)    Hgb urine dipstick MODERATE (*)    Bacteria, UA RARE (*)    All other components within normal limits  CBC WITH DIFFERENTIAL/PLATELET - Abnormal; Notable for the following components:   WBC 11.8 (*)    Hemoglobin 11.7 (*)    HCT 34.6 (*)    Neutro Abs 9.3 (*)    All other components within normal limits  COMPREHENSIVE METABOLIC PANEL - Abnormal; Notable for the following components:   Sodium 134 (*)    Glucose, Bld 106 (*)    Creatinine, Ser 0.37 (*)    All other components within normal limits  HCG, QUANTITATIVE, PREGNANCY - Abnormal; Notable for the following components:   hCG, Beta Chain, Quant, S 11/03/22 (*)    All other components within normal limits  ABO/RH  ANTIBODY SCREEN  RHOGAM INJECTION   RADIOLOGY Obstetric ultrasound reviewed and interpreted by me with intrauterine pregnancy and appropriate fetal heart rate.  PROCEDURES:  Critical Care performed: No  Procedures   MEDICATIONS ORDERED IN ED: Medications  rho (d) immune globulin (RHIG/RHOPHYLAC) injection 300 mcg (300 mcg Intramuscular Given 09/03/22 1915)     IMPRESSION / MDM / ASSESSMENT AND PLAN / ED COURSE  I reviewed the triage vital signs and the nursing notes.                              31 y.o. female G3P0020 at approximately 13 weeks of pregnancy who presents to the ED complaining of vaginal bleeding developing overnight last night with mild crampy abdominal pain.  Patient's presentation is most consistent with acute presentation with potential threat to life or bodily function.  Differential diagnosis  includes, but is not limited to, ectopic pregnancy, threatened miscarriage, completed miscarriage, anemia.  Patient nontoxic-appearing and in no acute distress, vital signs are unremarkable.  She has a benign abdominal exam and labs are reassuring with no significant anemia, leukocytosis, electrolyte abnormality, or AKI.  Obstetric ultrasound shows intrauterine pregnancy with appropriate fetal heart rate, presentation consistent with threatened miscarriage.  Patient is Rh- and we will give dose of RhoGAM here in the ED, after which she would be appropriate for discharge home with OB/GYN follow-up.  She was counseled to return to the ED for new or worsening symptoms, patient agrees with plan.      FINAL CLINICAL IMPRESSION(S) / ED DIAGNOSES   Final diagnoses:  Threatened miscarriage     Rx / DC Orders   ED Discharge Orders     None        Note:  This document was prepared using Dragon voice recognition software and may include unintentional dictation errors.   Chesley Noon, MD 09/03/22 [phone]

## 2022-09-03 NOTE — ED Notes (Addendum)
Pt verbalized understanding of discharge instructions and follow-up care instructions. Pt advised if symptoms worsen to return to ED. Paper signature obtained for discharge signature.

## 2022-09-03 NOTE — ED Provider Triage Note (Signed)
Emergency Medicine Provider Triage Evaluation Note  Linda Madden , a 31 y.o. female G3P0 currently 13 weeks was evaluated in triage.  Pt complains of vaginal bleeding that began at midnight. Does not need to wear a pad. Notices when she wipes. No trauma. Has had cramping since last night. No unilateral pain. Has history of ectopic. No vomiting. No fever  Review of Systems  Positive: Vag bleeding, cramping Negative: N/v  Physical Exam  LMP 05/30/2022 (Exact Date)  Gen:   Awake, no distress   Resp:  Normal effort  MSK:   Moves extremities without difficulty  Other:    Medical Decision Making  Medically screening exam initiated at 3:52 PM.  Appropriate orders placed.  Linda Madden was informed that the remainder of the evaluation will be completed by another provider, this initial triage assessment does not replace that evaluation, and the importance of remaining in the ED until their evaluation is complete.     Jackelyn Hoehn, PA-C 09/03/22 1556

## 2022-09-03 NOTE — ED Triage Notes (Signed)
Pt via POV from home. Pt c/o intermittent abd pain and vaginal spotting since last night. States it is not heavy but enough to see it every times she wipes. Pt has a hx of ectopic and miscarriage. Pt is [redacted] weeks pregnant. Pt is A&Ox4 and NAD

## 2022-09-04 LAB — RHOGAM INJECTION: Unit division: 0

## 2022-09-29 ENCOUNTER — Other Ambulatory Visit: Payer: Managed Care, Other (non HMO)

## 2022-09-30 ENCOUNTER — Other Ambulatory Visit: Payer: Managed Care, Other (non HMO)

## 2022-09-30 ENCOUNTER — Encounter: Payer: Self-pay | Admitting: Obstetrics and Gynecology

## 2022-09-30 ENCOUNTER — Other Ambulatory Visit: Payer: Self-pay

## 2022-09-30 DIAGNOSIS — Z1379 Encounter for other screening for genetic and chromosomal anomalies: Secondary | ICD-10-CM

## 2022-10-03 LAB — AFP, SERUM, OPEN SPINA BIFIDA
AFP MoM: 0.99
AFP Value: 35.4 ng/mL
Gest. Age on Collection Date: 17 weeks
Maternal Age At EDD: 30.9 yr
OSBR Risk 1 IN: 10000
Test Results:: NEGATIVE
Weight: 170 [lb_av]

## 2022-10-16 ENCOUNTER — Other Ambulatory Visit: Payer: Self-pay | Admitting: Obstetrics and Gynecology

## 2022-10-16 DIAGNOSIS — Z363 Encounter for antenatal screening for malformations: Secondary | ICD-10-CM

## 2022-10-20 ENCOUNTER — Ambulatory Visit: Payer: Managed Care, Other (non HMO)

## 2022-10-20 DIAGNOSIS — Z363 Encounter for antenatal screening for malformations: Secondary | ICD-10-CM

## 2022-10-20 DIAGNOSIS — Z3A19 19 weeks gestation of pregnancy: Secondary | ICD-10-CM

## 2022-10-20 DIAGNOSIS — Z3689 Encounter for other specified antenatal screening: Secondary | ICD-10-CM | POA: Diagnosis not present

## 2022-10-21 ENCOUNTER — Ambulatory Visit: Payer: Managed Care, Other (non HMO) | Admitting: Obstetrics and Gynecology

## 2022-10-21 ENCOUNTER — Encounter: Payer: Self-pay | Admitting: Obstetrics and Gynecology

## 2022-10-21 VITALS — BP 105/73 | HR 106 | Wt 173.8 lb

## 2022-10-21 DIAGNOSIS — Z23 Encounter for immunization: Secondary | ICD-10-CM | POA: Diagnosis not present

## 2022-10-21 DIAGNOSIS — Z3482 Encounter for supervision of other normal pregnancy, second trimester: Secondary | ICD-10-CM

## 2022-10-21 DIAGNOSIS — Z3A20 20 weeks gestation of pregnancy: Secondary | ICD-10-CM

## 2022-10-21 LAB — POCT URINALYSIS DIPSTICK OB
Bilirubin, UA: NEGATIVE
Blood, UA: NEGATIVE
Glucose, UA: NEGATIVE
Ketones, UA: NEGATIVE
Leukocytes, UA: NEGATIVE
Nitrite, UA: NEGATIVE
POC,PROTEIN,UA: NEGATIVE
Spec Grav, UA: 1.015 (ref 1.010–1.025)
Urobilinogen, UA: 0.2 E.U./dL
pH, UA: 7.5 (ref 5.0–8.0)

## 2022-10-21 NOTE — Progress Notes (Signed)
ROB: Complains of some increased vomiting.  Nausea and vomiting literature sheet given.  TSH today (patient is taking Synthroid).  Not feeling baby move yet-discussed.  Placenta Circumvalata discussed.

## 2022-10-21 NOTE — Progress Notes (Signed)
ROB. Patient states recently experiencing an increase in nausea and vomiting.She states concerns of increasing HR with minimal activity.  She states not feeling fetal movements at this time. Flu shot given. Patient states no questions or concerns at this time.

## 2022-10-22 LAB — TSH: TSH: 0.615 u[IU]/mL (ref 0.450–4.500)

## 2022-10-26 NOTE — Progress Notes (Signed)
All results within normal range.

## 2022-10-29 ENCOUNTER — Encounter: Payer: Self-pay | Admitting: Obstetrics and Gynecology

## 2022-11-18 ENCOUNTER — Ambulatory Visit: Payer: Managed Care, Other (non HMO) | Admitting: Obstetrics and Gynecology

## 2022-11-18 VITALS — BP 121/84 | HR 94 | Wt 176.0 lb

## 2022-11-18 DIAGNOSIS — Z131 Encounter for screening for diabetes mellitus: Secondary | ICD-10-CM

## 2022-11-18 DIAGNOSIS — Z113 Encounter for screening for infections with a predominantly sexual mode of transmission: Secondary | ICD-10-CM

## 2022-11-18 DIAGNOSIS — Z3A24 24 weeks gestation of pregnancy: Secondary | ICD-10-CM

## 2022-11-18 DIAGNOSIS — Z3482 Encounter for supervision of other normal pregnancy, second trimester: Secondary | ICD-10-CM

## 2022-11-18 LAB — POCT URINALYSIS DIPSTICK
Bilirubin, UA: NEGATIVE
Blood, UA: NEGATIVE
Glucose, UA: NEGATIVE
Ketones, UA: NEGATIVE
Leukocytes, UA: NEGATIVE
Nitrite, UA: NEGATIVE
Protein, UA: NEGATIVE
Spec Grav, UA: 1.015 (ref 1.010–1.025)
Urobilinogen, UA: 0.2 E.U./dL
pH, UA: 6.5 (ref 5.0–8.0)

## 2022-11-18 NOTE — Progress Notes (Signed)
ROB: Still has occasional nausea but only every few days.  Placental issue with Jen Mow discussed.  Likely ultrasound at 34 weeks for growth with 36-week NSTs.  1 hour GCT next visit.

## 2022-12-16 ENCOUNTER — Ambulatory Visit (INDEPENDENT_AMBULATORY_CARE_PROVIDER_SITE_OTHER): Payer: Managed Care, Other (non HMO) | Admitting: Obstetrics and Gynecology

## 2022-12-16 ENCOUNTER — Other Ambulatory Visit: Payer: Managed Care, Other (non HMO)

## 2022-12-16 VITALS — BP 106/74 | HR 99 | Wt 176.0 lb

## 2022-12-16 DIAGNOSIS — Z3A28 28 weeks gestation of pregnancy: Secondary | ICD-10-CM

## 2022-12-16 DIAGNOSIS — Z131 Encounter for screening for diabetes mellitus: Secondary | ICD-10-CM

## 2022-12-16 DIAGNOSIS — Z3482 Encounter for supervision of other normal pregnancy, second trimester: Secondary | ICD-10-CM

## 2022-12-16 DIAGNOSIS — Z113 Encounter for screening for infections with a predominantly sexual mode of transmission: Secondary | ICD-10-CM

## 2022-12-16 LAB — POCT URINALYSIS DIPSTICK
Leukocytes, UA: NEGATIVE
Nitrite, UA: NEGATIVE
Spec Grav, UA: 1.015 (ref 1.010–1.025)
pH, UA: 7 (ref 5.0–8.0)

## 2022-12-16 NOTE — Progress Notes (Signed)
ROB: No complaints.  Doing well.  Reports daily fetal movement.  1 hour GCT done today.  Tdap next visit.  Patient interested in RSV vaccine-recommend during pregnancy.

## 2022-12-16 NOTE — Progress Notes (Addendum)
ROB. Patient states no concerns at this time. She is having her 28 week labs today. No LOF. No VB. Still feeling plenty of movement. BTC today, will get TDAP at [place] d/t being out today.

## 2022-12-17 ENCOUNTER — Encounter: Payer: Self-pay | Admitting: Obstetrics and Gynecology

## 2022-12-17 LAB — CBC WITH DIFFERENTIAL/PLATELET
Basophils Absolute: 0 10*3/uL (ref 0.0–0.2)
Basos: 0 %
EOS (ABSOLUTE): 0.1 10*3/uL (ref 0.0–0.4)
Eos: 1 %
Hematocrit: 35.1 % (ref 34.0–46.6)
Hemoglobin: 11.7 g/dL (ref 11.1–15.9)
Immature Grans (Abs): 0.1 10*3/uL (ref 0.0–0.1)
Immature Granulocytes: 1 %
Lymphocytes Absolute: 1.1 10*3/uL (ref 0.7–3.1)
Lymphs: 11 %
MCH: 29.7 pg (ref 26.6–33.0)
MCHC: 33.3 g/dL (ref 31.5–35.7)
MCV: 89 fL (ref 79–97)
Monocytes Absolute: 0.6 10*3/uL (ref 0.1–0.9)
Monocytes: 6 %
Neutrophils Absolute: 7.8 10*3/uL — ABNORMAL HIGH (ref 1.4–7.0)
Neutrophils: 81 %
Platelets: 279 10*3/uL (ref 150–450)
RBC: 3.94 x10E6/uL (ref 3.77–5.28)
RDW: 12.4 % (ref 11.7–15.4)
WBC: 9.6 10*3/uL (ref 3.4–10.8)

## 2022-12-17 LAB — GLUCOSE, 1 HOUR GESTATIONAL: Gestational Diabetes Screen: 118 mg/dL (ref 70–139)

## 2022-12-17 LAB — RPR: RPR Ser Ql: NONREACTIVE

## 2023-01-06 ENCOUNTER — Encounter: Payer: Self-pay | Admitting: Obstetrics

## 2023-01-06 ENCOUNTER — Ambulatory Visit (INDEPENDENT_AMBULATORY_CARE_PROVIDER_SITE_OTHER): Payer: Managed Care, Other (non HMO) | Admitting: Obstetrics

## 2023-01-06 VITALS — BP 116/78 | HR 84 | Wt 179.5 lb

## 2023-01-06 DIAGNOSIS — Z3A31 31 weeks gestation of pregnancy: Secondary | ICD-10-CM | POA: Diagnosis not present

## 2023-01-06 DIAGNOSIS — O360130 Maternal care for anti-D [Rh] antibodies, third trimester, not applicable or unspecified: Secondary | ICD-10-CM | POA: Diagnosis not present

## 2023-01-06 DIAGNOSIS — Z23 Encounter for immunization: Secondary | ICD-10-CM | POA: Diagnosis not present

## 2023-01-06 DIAGNOSIS — Z3480 Encounter for supervision of other normal pregnancy, unspecified trimester: Secondary | ICD-10-CM

## 2023-01-06 LAB — POCT URINALYSIS DIPSTICK OB
Bilirubin, UA: NEGATIVE
Blood, UA: NEGATIVE
Glucose, UA: NEGATIVE
Ketones, UA: NEGATIVE
Leukocytes, UA: NEGATIVE
Nitrite, UA: NEGATIVE
POC,PROTEIN,UA: NEGATIVE
Spec Grav, UA: 1.02 (ref 1.010–1.025)
Urobilinogen, UA: 0.2 E.U./dL — AB
pH, UA: 7 (ref 5.0–8.0)

## 2023-01-06 MED ORDER — RHO D IMMUNE GLOBULIN 1500 UNIT/2ML IJ SOSY
300.0000 ug | PREFILLED_SYRINGE | Freq: Once | INTRAMUSCULAR | Status: AC
  Administered 2023-01-06: 300 ug via INTRAMUSCULAR

## 2023-01-06 NOTE — Progress Notes (Signed)
ROB at [redacted]w[redacted]d. Active baby. Denies ctx, LOF, and vaginal bleeding. Linda Madden reports an itchy rash around her breasts that started as small red papules and progressed to a plaque. She has been using a cream on it which temporarily relieves the itching. Erythema noted under and around breasts, darkened rash in between breasts and on inner aspect. Possible eczema. Will try hydrocortisone cream and emollients. F/u with derm if no improvement.  [name] and her partner have done CBE, CPR, and breastfeeding class. She plans an unmedicated birh and NFP for birth control. Discussed LAM. RTC in 2 weeks for ROB and growth scan. TSH today.  Lloyd Huger, CNM

## 2023-01-07 LAB — TSH: TSH: 1.52 u[IU]/mL (ref 0.450–4.500)

## 2023-01-08 ENCOUNTER — Encounter: Payer: Self-pay | Admitting: Obstetrics and Gynecology

## 2023-01-09 ENCOUNTER — Other Ambulatory Visit: Payer: Self-pay

## 2023-01-09 DIAGNOSIS — R21 Rash and other nonspecific skin eruption: Secondary | ICD-10-CM

## 2023-01-21 ENCOUNTER — Other Ambulatory Visit: Payer: Self-pay

## 2023-01-21 DIAGNOSIS — O43113 Circumvallate placenta, third trimester: Secondary | ICD-10-CM

## 2023-01-21 DIAGNOSIS — Z3482 Encounter for supervision of other normal pregnancy, second trimester: Secondary | ICD-10-CM

## 2023-01-21 NOTE — Progress Notes (Signed)
New order needed/placed for [place] Comp +14 due to no tech at [facility]. Patient being rescheduled to outpatient facility.

## 2023-01-22 ENCOUNTER — Ambulatory Visit: Payer: Managed Care, Other (non HMO) | Admitting: Obstetrics and Gynecology

## 2023-01-22 ENCOUNTER — Other Ambulatory Visit: Payer: Managed Care, Other (non HMO)

## 2023-01-22 ENCOUNTER — Encounter: Payer: Self-pay | Admitting: Obstetrics and Gynecology

## 2023-01-22 VITALS — BP 109/77 | HR 85 | Wt 181.2 lb

## 2023-01-22 DIAGNOSIS — Z3482 Encounter for supervision of other normal pregnancy, second trimester: Secondary | ICD-10-CM

## 2023-01-22 DIAGNOSIS — O43113 Circumvallate placenta, third trimester: Secondary | ICD-10-CM

## 2023-01-22 DIAGNOSIS — Z3A33 33 weeks gestation of pregnancy: Secondary | ICD-10-CM

## 2023-01-22 LAB — POCT URINALYSIS DIPSTICK OB
Bilirubin, UA: NEGATIVE
Blood, UA: NEGATIVE
Glucose, UA: NEGATIVE
Ketones, UA: NEGATIVE
Leukocytes, UA: NEGATIVE
Nitrite, UA: NEGATIVE
Spec Grav, UA: 1.01 (ref 1.010–1.025)
Urobilinogen, UA: 0.2 E.U./dL
pH, UA: 7 (ref 5.0–8.0)

## 2023-01-22 NOTE — Progress Notes (Signed)
ROB: Patient is a 32 y.o. G3P0020 at [redacted]w[redacted]d. Doing well with no complaints. Scheduled for f/u ultrasound Monday for circumvallate placenta. Declines contraception.  Breastfeeding.  RTC in 2 weeks.

## 2023-01-22 NOTE — Patient Instructions (Signed)
WHAT OB PATIENTS CAN EXPECT  Confirmation of pregnancy and ultrasound ordered if medically indicated-[redacted] weeks gestation New OB (NOB) intake with nurse and New OB (NOB) labs- [redacted] weeks gestation New OB (NOB) physical examination with provider- 11/[redacted] weeks gestation Flu vaccine-[redacted] weeks gestation Anatomy scan-[redacted] weeks gestation Glucose tolerance test, blood work to test for anemia, T-dap vaccine-[redacted] weeks gestation Vaginal swabs/cultures-STD/Group B strep-[redacted] weeks gestation Appointments every 4 weeks until 28 weeks Every 2 weeks from 28 weeks until 36 weeks Weekly visits from 36 weeks until delivery   

## 2023-01-26 ENCOUNTER — Ambulatory Visit: Admission: RE | Admit: 2023-01-26 | Payer: Managed Care, Other (non HMO) | Source: Ambulatory Visit

## 2023-01-30 ENCOUNTER — Telehealth: Payer: Self-pay

## 2023-01-30 ENCOUNTER — Encounter: Payer: Self-pay | Admitting: Obstetrics and Gynecology

## 2023-01-30 ENCOUNTER — Ambulatory Visit
Admission: RE | Admit: 2023-01-30 | Discharge: 2023-01-30 | Disposition: A | Discharge status: Home / Self Care | Payer: Managed Care, Other (non HMO) | Source: Ambulatory Visit | Attending: Obstetrics

## 2023-01-30 ENCOUNTER — Other Ambulatory Visit: Payer: Managed Care, Other (non HMO)

## 2023-01-30 DIAGNOSIS — Z3A35 35 weeks gestation of pregnancy: Secondary | ICD-10-CM | POA: Insufficient documentation

## 2023-01-30 DIAGNOSIS — O321XX0 Maternal care for breech presentation, not applicable or unspecified: Secondary | ICD-10-CM | POA: Insufficient documentation

## 2023-01-30 DIAGNOSIS — O43113 Circumvallate placenta, third trimester: Secondary | ICD-10-CM | POA: Diagnosis present

## 2023-01-30 DIAGNOSIS — O4103X0 Oligohydramnios, third trimester, not applicable or unspecified: Secondary | ICD-10-CM | POA: Insufficient documentation

## 2023-01-30 DIAGNOSIS — Z3482 Encounter for supervision of other normal pregnancy, second trimester: Secondary | ICD-10-CM

## 2023-01-30 NOTE — Telephone Encounter (Signed)
[facility] Radiology called to make sure patients ultrasound from this morning was reviewed. Ultrasound was preformed due to placenta circumvallate and fetal growth.   IMPRESSION: 1. Single living intrauterine gestation in breech lie at 34 weeks 0 days by average ultrasound age, compared to expected gestational age of [redacted] weeks 0 days by assigned dating. 2. Estimated fetal weight 2479 g, at the 36th percentile for expected gestational age. 3. Borderline oligohydramnios. AFI 7.6, just below the 5th percentile for gestational age. 4. Otherwise no fetal or maternal abnormalities detected, with limitations as detailed.   Please advise on anything further.

## 2023-02-03 ENCOUNTER — Other Ambulatory Visit: Payer: Managed Care, Other (non HMO)

## 2023-02-05 ENCOUNTER — Encounter: Payer: Self-pay | Admitting: Obstetrics and Gynecology

## 2023-02-05 ENCOUNTER — Ambulatory Visit (INDEPENDENT_AMBULATORY_CARE_PROVIDER_SITE_OTHER): Payer: Managed Care, Other (non HMO) | Admitting: Obstetrics and Gynecology

## 2023-02-05 ENCOUNTER — Other Ambulatory Visit (HOSPITAL_COMMUNITY)
Admission: RE | Admit: 2023-02-05 | Discharge: 2023-02-05 | Disposition: A | Discharge status: Home / Self Care | Payer: Managed Care, Other (non HMO) | Source: Ambulatory Visit | Attending: Obstetrics and Gynecology | Admitting: Obstetrics and Gynecology

## 2023-02-05 VITALS — BP 104/76 | HR 98 | Wt 185.0 lb

## 2023-02-05 DIAGNOSIS — O43113 Circumvallate placenta, third trimester: Secondary | ICD-10-CM | POA: Insufficient documentation

## 2023-02-05 DIAGNOSIS — Z3483 Encounter for supervision of other normal pregnancy, third trimester: Secondary | ICD-10-CM | POA: Diagnosis not present

## 2023-02-05 DIAGNOSIS — Z3685 Encounter for antenatal screening for Streptococcus B: Secondary | ICD-10-CM

## 2023-02-05 DIAGNOSIS — O288 Other abnormal findings on antenatal screening of mother: Secondary | ICD-10-CM

## 2023-02-05 DIAGNOSIS — Z113 Encounter for screening for infections with a predominantly sexual mode of transmission: Secondary | ICD-10-CM | POA: Diagnosis present

## 2023-02-05 DIAGNOSIS — Z3A35 35 weeks gestation of pregnancy: Secondary | ICD-10-CM | POA: Diagnosis present

## 2023-02-05 DIAGNOSIS — O321XX0 Maternal care for breech presentation, not applicable or unspecified: Secondary | ICD-10-CM | POA: Insufficient documentation

## 2023-02-05 HISTORY — DX: Maternal care for breech presentation, not applicable or unspecified: O32.1XX0

## 2023-02-05 HISTORY — DX: Other abnormal findings on antenatal screening of mother: O28.8

## 2023-02-05 HISTORY — DX: Circumvallate placenta, third trimester: O43.113

## 2023-02-05 LAB — POCT URINALYSIS DIPSTICK OB
Bilirubin, UA: NEGATIVE
Blood, UA: NEGATIVE
Glucose, UA: NEGATIVE
Ketones, UA: NEGATIVE
Leukocytes, UA: NEGATIVE
Nitrite, UA: NEGATIVE
Spec Grav, UA: 1.01 (ref 1.010–1.025)
Urobilinogen, UA: 0.2 E.U./dL
pH, UA: 7.5 (ref 5.0–8.0)

## 2023-02-05 NOTE — Progress Notes (Signed)
ROB: Patient is a 32 y.o. G3P0020 at [redacted]w[redacted]d who presents for OB visit.  Patient expresses concern over recent ultrasound results.  Ultrasound was performed approximately 4 days ago, was noting lower than normal fluid (AFI 7.5 cm, 5%ile) and also breech presentation.  Ultrasound performed for circumvallate placenta and growth surveillance.  Growth noted to be 36%ile. .  Patient reports she was also concerned about the BPD measuring smaller and is concerned about the baby's head growth.  I discussed all findings with patient.  Given reassurance about fetal growth overall.  Also discussed management options for breech presentation including multiple screening maneuvers, [place] blasting, ECV, or consideration of cesarean section for mode of delivery if any other methods unsuccessful.  Will follow-up with a growth scan again in 2 weeks to reassess fluid.  At that time if baby is still breech can consider ECV if patient is willing.   36-week cultures performed today.  RTC in 1 week.  To begin NSTs at that time.

## 2023-02-06 ENCOUNTER — Encounter: Payer: Self-pay | Admitting: Obstetrics and Gynecology

## 2023-02-06 ENCOUNTER — Other Ambulatory Visit: Payer: Self-pay

## 2023-02-06 DIAGNOSIS — O288 Other abnormal findings on antenatal screening of mother: Secondary | ICD-10-CM

## 2023-02-06 LAB — CERVICOVAGINAL ANCILLARY ONLY
Chlamydia: NEGATIVE
Comment: NEGATIVE
Comment: NORMAL
Neisseria Gonorrhea: NEGATIVE

## 2023-02-06 NOTE — Progress Notes (Signed)
BPP order added to ob f/u ultrasound

## 2023-02-07 LAB — STREP GP B NAA: Strep Gp B NAA: NEGATIVE

## 2023-02-09 ENCOUNTER — Telehealth: Payer: Self-pay

## 2023-02-09 NOTE — Telephone Encounter (Signed)
Patient transferred by front desk. She had a question about her ultrasound appointment. She spoke with ultrasound at [facility] and mentioned she was supposed to have an NST. She states [facility] wasn't sure about the orders. Advised patient per our phone conversation on Friday, she is scheduled for a growth scan 2/14, we added a BPP (explained purpose of ultrasound) which eliminated the need for NST. Therefore her NST for 2/14 was cancelled. She will have u/s for growth/BPP 2/14 and see Lydia on 2/15. Advised I had discussed this with Ultrasound manager on Friday to get permission to add the BPP to her already scheduled growth. Patient verbalizes understanding.

## 2023-02-11 ENCOUNTER — Ambulatory Visit: Payer: Managed Care, Other (non HMO)

## 2023-02-11 ENCOUNTER — Other Ambulatory Visit: Payer: Managed Care, Other (non HMO)

## 2023-02-11 ENCOUNTER — Ambulatory Visit
Admission: RE | Admit: 2023-02-11 | Discharge: 2023-02-11 | Disposition: A | Discharge status: Home / Self Care | Payer: Managed Care, Other (non HMO) | Source: Ambulatory Visit | Attending: Obstetrics and Gynecology | Admitting: Obstetrics and Gynecology

## 2023-02-11 DIAGNOSIS — O321XX0 Maternal care for breech presentation, not applicable or unspecified: Secondary | ICD-10-CM | POA: Insufficient documentation

## 2023-02-11 DIAGNOSIS — O288 Other abnormal findings on antenatal screening of mother: Secondary | ICD-10-CM | POA: Diagnosis present

## 2023-02-12 ENCOUNTER — Ambulatory Visit (INDEPENDENT_AMBULATORY_CARE_PROVIDER_SITE_OTHER): Payer: Managed Care, Other (non HMO) | Admitting: Licensed Practical Nurse

## 2023-02-12 ENCOUNTER — Encounter: Payer: Self-pay | Admitting: Licensed Practical Nurse

## 2023-02-12 VITALS — BP 107/78 | HR 98 | Wt 182.9 lb

## 2023-02-12 DIAGNOSIS — Z3483 Encounter for supervision of other normal pregnancy, third trimester: Secondary | ICD-10-CM

## 2023-02-12 DIAGNOSIS — Z3A36 36 weeks gestation of pregnancy: Secondary | ICD-10-CM

## 2023-02-12 LAB — POCT URINALYSIS DIPSTICK
Bilirubin, UA: NEGATIVE
Blood, UA: NEGATIVE
Glucose, UA: NEGATIVE
Ketones, UA: NEGATIVE
Leukocytes, UA: NEGATIVE
Nitrite, UA: NEGATIVE
Protein, UA: NEGATIVE
Spec Grav, UA: 1.015 (ref 1.010–1.025)
Urobilinogen, UA: 1 E.U./dL
pH, UA: 7 (ref 5.0–8.0)

## 2023-02-12 NOTE — Progress Notes (Signed)
Routine Prenatal Care Visit  Subjective  Linda Madden is a 32 y.o. G3P0020 at 82w6dbeing seen today for ongoing prenatal care.  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  ----------------------------------------------------------------------------------- Patient reports  sleep difficulties d/t hip discomfort.  .Marland Kitchen Here with her mother.  -Fetus breech, declines ECV, open to c/s "if I have too', doing Spinning babies.  -Mood has been good -Her husband will have time off and her mother is nearby for PP support,   Contractions: Not present. Vag. Bleeding: None.  Movement: Present. Leaking Fluid denies.  ----------------------------------------------------------------------------------- The following portions of the patient's history were reviewed and updated as appropriate: allergies, current medications, past family history, past medical history, past social history, past surgical history and problem list. Problem list updated.  Objective  Blood pressure 107/78, pulse 98, weight 182 lb 14.4 oz (83 kg), last menstrual period 05/30/2022. Pregravid weight 173 lb (78.5 kg) Total Weight Gain 9 lb 14.4 oz (4.491 kg) Urinalysis: Urine Protein    Urine Glucose    Fetal Status: Fetal Heart Rate (bpm): 135 Fundal Height: 38 cm Movement: Present  Presentation:  (breech)  General:  Alert, oriented and cooperative. Patient is in no acute distress.  Skin: Skin is warm and dry. No rash noted.   Cardiovascular: Normal heart rate noted  Respiratory: Normal respiratory effort, no problems with respiration noted  Abdomen: Soft, gravid, appropriate for gestational age. Pain/Pressure: Absent     Pelvic:  Cervical exam deferred        Extremities: Normal range of  motion.  Edema: None  Mental Status: Normal mood and affect. Normal behavior. Normal judgment and thought content.   Assessment   32y.o. GMP:[id number] 37w6dy  03/06/2023, by Last Menstrual Period presenting for routine prenatal visit  Plan   g3 Problems (from 07/11/22 to present)     Problem Noted Resolved   Supervision of other normal pregnancy, antepartum 08/07/2022 by JoCleophas DunkerCMA No   Overview Addendum 01/22/2023  9:57 AM by ChRubie MaidMD     Clinical Staff Provider  Office Location  [facility] Dating  03/06/2023, by Last Menstrual Period  Language  English Anatomy US[place] except circumvallate placenta  Flu Vaccine   Genetic Screen  NIPS: MaterniT21 neg, female  TDaP vaccine   01/06/23  Hgb A1C or  GTT Early : Third trimester : 131  Covid    LAB RESULTS   Rhogam  --/--/B NEG [facility] [place] [address]([phone])  Blood Type --/--/B NEG [facility] [place] [phone]  Feeding Plan Breast Antibody POS (09/06 1558)  Contraception NFP Rubella 1.62 (08/11 0824)  Circumcision  RPR Non Reactive (12/19 0957)   Pediatrician  Undecided HBsAg Negative (08/11 0824)   Support Person Charlie HIV Non Reactive (08/11 0824)  Prenatal Classes Done Varicella < 135 (08/11 0824)    GBS  (For PCN allergy, check sensitivities)   BTL Consent  Hep C Non Reactive (08/11 0824)   VBAC Consent  Pap Diagnosis  Date Value Ref Range Status  08/27/2022   Final   - Negative for intraepithelial lesion or malignancy (NILM)      Hgb Electro  CF      SMA                    Preterm labor symptoms and general obstetric precautions including but not limited to vaginal bleeding, contractions, leaking of fluid and fetal movement were reviewed in detail with the patient. Please refer to After Visit Summary for other counseling recommendations.   Return in about 1 week  (around 02/19/2023) for ROB, needs to see MD .  After pt left clinic: Reviewed Plan with Dr Amalia Hailey, c/s scheduled for March 1 at 0730, this will be communicated to the pt at her next visit.    Roberto Scales, [facility]  02/12/23  9:23 AM

## 2023-02-18 ENCOUNTER — Encounter: Payer: Managed Care, Other (non HMO) | Admitting: Obstetrics

## 2023-02-18 ENCOUNTER — Other Ambulatory Visit: Payer: Managed Care, Other (non HMO)

## 2023-02-19 ENCOUNTER — Ambulatory Visit: Payer: Managed Care, Other (non HMO)

## 2023-02-19 ENCOUNTER — Encounter: Payer: Self-pay | Admitting: Obstetrics and Gynecology

## 2023-02-19 ENCOUNTER — Ambulatory Visit: Payer: Managed Care, Other (non HMO) | Admitting: Obstetrics and Gynecology

## 2023-02-19 VITALS — BP 118/76 | HR 78 | Ht 65.0 in | Wt 188.7 lb

## 2023-02-19 VITALS — BP 118/76 | HR 93 | Wt 188.7 lb

## 2023-02-19 DIAGNOSIS — Z3483 Encounter for supervision of other normal pregnancy, third trimester: Secondary | ICD-10-CM

## 2023-02-19 DIAGNOSIS — Z3482 Encounter for supervision of other normal pregnancy, second trimester: Secondary | ICD-10-CM

## 2023-02-19 DIAGNOSIS — Z3490 Encounter for supervision of normal pregnancy, unspecified, unspecified trimester: Secondary | ICD-10-CM | POA: Insufficient documentation

## 2023-02-19 DIAGNOSIS — O321XX0 Maternal care for breech presentation, not applicable or unspecified: Secondary | ICD-10-CM

## 2023-02-19 DIAGNOSIS — Z3A37 37 weeks gestation of pregnancy: Secondary | ICD-10-CM

## 2023-02-19 DIAGNOSIS — O288 Other abnormal findings on antenatal screening of mother: Secondary | ICD-10-CM

## 2023-02-19 DIAGNOSIS — Z3480 Encounter for supervision of other normal pregnancy, unspecified trimester: Secondary | ICD-10-CM

## 2023-02-19 HISTORY — DX: 37 weeks gestation of pregnancy: Z3A.37

## 2023-02-19 HISTORY — DX: Encounter for supervision of normal pregnancy, unspecified, unspecified trimester: Z34.90

## 2023-02-19 NOTE — Progress Notes (Signed)
ROB: No problems.  Daily fetal movement.  NST reactive today.  Patient scheduled for cesarean delivery next Friday for breech.  NST next week.  Questions regarding cesarean delivery answered.

## 2023-02-19 NOTE — Progress Notes (Signed)
ROB. Patient states daily fetal movement, declines pain or pressure. She states wanting to discuss cesarean today. NST today. She states no additional questions or concerns at this time.

## 2023-02-19 NOTE — Progress Notes (Signed)
    NURSE VISIT NOTE  Subjective:    Patient ID: Linda Madden, female    DOB: Jan 09, 1991, 32 y.o.   MRN: [id number]  HPI  Patient is a 32 y.o. G58P0020 female who presents for fetal monitoring per order from Roberto Scales, [place].   Objective:    BP 118/76   Pulse 78   Ht 5' 5"$  (1.651 m)   Wt 188 lb 11.2 oz (85.6 kg)   LMP 05/30/2022 (Exact Date)   BMI 31.40 kg/m  Estimated Date of Delivery: 03/06/23  Assessment:   1. Encounter for supervision of other normal pregnancy in third trimester   2. AFI (amniotic fluid index) borderline low   3. [redacted] weeks gestation of pregnancy      Plan:   Results reviewed and discussed with patient by  Jeannie Fend, MD.     Otila Kluver, LPN

## 2023-02-19 NOTE — Patient Instructions (Signed)

## 2023-02-23 ENCOUNTER — Encounter: Payer: Self-pay | Admitting: Obstetrics and Gynecology

## 2023-02-24 ENCOUNTER — Encounter: Payer: Self-pay | Admitting: Obstetrics and Gynecology

## 2023-02-26 ENCOUNTER — Encounter
Admission: RE | Admit: 2023-02-26 | Discharge: 2023-02-26 | Disposition: A | Discharge status: Home / Self Care | Payer: Managed Care, Other (non HMO) | Source: Ambulatory Visit | Attending: Obstetrics and Gynecology | Admitting: Obstetrics and Gynecology

## 2023-02-26 ENCOUNTER — Encounter: Payer: Self-pay | Admitting: Obstetrics and Gynecology

## 2023-02-26 ENCOUNTER — Ambulatory Visit: Payer: Managed Care, Other (non HMO) | Admitting: Certified Nurse Midwife

## 2023-02-26 ENCOUNTER — Other Ambulatory Visit: Payer: Self-pay

## 2023-02-26 ENCOUNTER — Ambulatory Visit: Payer: Managed Care, Other (non HMO)

## 2023-02-26 VITALS — BP 116/80 | HR 86 | Ht 65.0 in | Wt 186.4 lb

## 2023-02-26 VITALS — BP 116/80 | HR 86 | Wt 186.4 lb

## 2023-02-26 DIAGNOSIS — Z01812 Encounter for preprocedural laboratory examination: Secondary | ICD-10-CM | POA: Insufficient documentation

## 2023-02-26 DIAGNOSIS — O288 Other abnormal findings on antenatal screening of mother: Secondary | ICD-10-CM

## 2023-02-26 DIAGNOSIS — D649 Anemia, unspecified: Secondary | ICD-10-CM

## 2023-02-26 DIAGNOSIS — Z3A38 38 weeks gestation of pregnancy: Secondary | ICD-10-CM | POA: Diagnosis not present

## 2023-02-26 DIAGNOSIS — O43113 Circumvallate placenta, third trimester: Secondary | ICD-10-CM

## 2023-02-26 DIAGNOSIS — Z3482 Encounter for supervision of other normal pregnancy, second trimester: Secondary | ICD-10-CM

## 2023-02-26 HISTORY — DX: 38 weeks gestation of pregnancy: Z3A.38

## 2023-02-26 LAB — POCT URINALYSIS DIPSTICK OB
Bilirubin, UA: NEGATIVE
Blood, UA: NEGATIVE
Glucose, UA: NEGATIVE
Ketones, UA: NEGATIVE
Leukocytes, UA: NEGATIVE
Nitrite, UA: NEGATIVE
POC,PROTEIN,UA: NEGATIVE
Spec Grav, UA: 1.01 (ref 1.010–1.025)
Urobilinogen, UA: 0.2 E.U./dL
pH, UA: 7 (ref 5.0–8.0)

## 2023-02-26 LAB — CBC
HCT: 31.9 % — ABNORMAL LOW (ref 36.0–46.0)
Hemoglobin: 10.8 g/dL — ABNORMAL LOW (ref 12.0–15.0)
MCH: 29.6 pg (ref 26.0–34.0)
MCHC: 33.9 g/dL (ref 30.0–36.0)
MCV: 87.4 fL (ref 80.0–100.0)
Platelets: 233 10*3/uL (ref 150–400)
RBC: 3.65 MIL/uL — ABNORMAL LOW (ref 3.87–5.11)
RDW: 13.5 % (ref 11.5–15.5)
WBC: 9.8 10*3/uL (ref 4.0–10.5)
nRBC: 0 % (ref 0.0–0.2)

## 2023-02-26 MED ORDER — LACTATED RINGERS IV BOLUS: 1000.0000 mL | Freq: Once | INTRAVENOUS | Status: DC

## 2023-02-26 MED ORDER — LACTATED RINGERS IV SOLN: INTRAVENOUS | Status: DC

## 2023-02-26 NOTE — Patient Instructions (Signed)
Your procedure is scheduled on: 02/27/23 Report to Arrive to the Emergency Department at 5:30 am.  If you have any questions regarding visitation etc you may contact the [facility]place at [phone]  Remember: Instructions that are not followed completely may result in serious medical risk, up to and including death, or upon the discretion of your surgeon and anesthesiologist your surgery may need to be rescheduled.     _X__ 1. Do not eat food or drink any liquids after midnight the night before your procedure.                  __X__2.  On the morning of surgery brush your teeth with toothpaste and water, you                 may rinse your mouth with mouthwash if you wish.  Do not swallow any              toothpaste of mouthwash.     _X__ 3.  No Alcohol for 24 hours before or after surgery.   _X__ 4.  Do Not Smoke or use e-cigarettes For 24 Hours Prior to Your Surgery.                 Do not use any chewable tobacco products for at least 6 hours prior to                 surgery.  ____  5.  Bring all medications with you on the day of surgery if instructed.   __X__  6.  Notify your doctor if there is any change in your medical condition      (cold, fever, infections).     Do not wear jewelry, make-up, hairpins, clips or nail polish. Do not wear lotions, powders, or perfumes.  Do not shave body hair 48 hours prior to surgery. Men may shave face and neck. Do not bring valuables to the hospital.    [facility] is not responsible for any belongings or valuables.  Contacts, dentures/partials or body piercings may not be worn into surgery. Bring a case for your contacts, glasses or hearing aids, a denture cup will be supplied. Leave your suitcase in the car. After surgery it may be brought to your room. For patients admitted to the hospital, discharge time is determined by your treatment team.   Patients discharged the day of surgery will not be allowed to drive home.   Please read over the  following fact sheets that you were given:   CHG soap  __X__ Take these medicines the morning of surgery with A SIP OF WATER:    1. omeprazole (PRILOSEC) 20 MG capsule   2. SYNTHROID 75 MCG tablet    __X__ Use CHG Soap/SAGE wipes as directed      Preparing for Surgery with CHLORHEXIDINE GLUCONATE (CHG) Soap  Chlorhexidine Gluconate (CHG) Soap  o An antiseptic cleaner that kills germs and bonds with the skin to continue killing germs even after washing  o Used for showering the night before surgery and morning of surgery  Before surgery, you can play an important role by reducing the number of germs on your skin.  CHG (Chlorhexidine gluconate) soap is an antiseptic cleanser which kills germs and bonds with the skin to continue killing germs even after washing.  Please do not use if you have an allergy to CHG or antibacterial soaps. If your skin becomes reddened/irritated stop using the CHG.  1. Shower the [employer]  BEFORE SURGERY and the MORNING OF SURGERY with CHG soap.  2. If you choose to wash your hair, wash your hair first as usual with your normal shampoo.  3. After shampooing, rinse your hair and body thoroughly to remove the shampoo.  4. Use CHG as you would any other liquid soap. You can apply CHG directly to the skin and wash gently with a scrungie or a clean washcloth.  5. Apply the CHG soap to your body only from the neck down. Do not use on open wounds or open sores. Avoid contact with your eyes, ears, mouth, and genitals (private parts). Wash face and genitals (private parts) with your normal soap. If planning to breast feed avoid using on breast tissue  6. Wash thoroughly, paying special attention to the area where your surgery will be performed.  7. Thoroughly rinse your body with warm water.  8. Do not shower/wash with your normal soap after using and rinsing off the CHG soap.  9. Pat yourself dry with a clean towel.  10. Wear clean pajamas to bed the night  before surgery.  12. Place clean sheets on your bed the night of your first shower and do not sleep with pets.  13. Shower again with the CHG soap on the day of surgery prior to arriving at the hospital.  14. Do not apply any deodorants/lotions/powders.  15. Please wear clean clothes to the hospital.

## 2023-02-26 NOTE — Progress Notes (Signed)
    NURSE VISIT NOTE  Subjective:    Patient ID: Linda Madden, female    DOB: [birth year], 33 y.o.   MRN: [id number]  HPI  Patient is a 32 y.o. G66P0020 female who presents for fetal monitoring per order from Jeannie Fend, MD.   Objective:    BP 116/80   Pulse 86   Ht 5' 5"$  (1.651 m)   Wt 186 lb 6.4 oz (84.6 kg)   LMP 05/30/2022 (Exact Date)   BMI 31.02 kg/m  Estimated Date of Delivery: 03/06/23  Assessment:   1. Encounter for supervision of other normal pregnancy in third trimester   2. AFI (amniotic fluid index) borderline low   3. Circumvallate placenta in third trimester   4. [redacted] weeks gestation of pregnancy      Plan:   Results reviewed and discussed with patient by  Philip Aspen, CNM.     Otila Kluver, LPN

## 2023-02-26 NOTE — Patient Instructions (Signed)
Cesarean Delivery, Care After The following information offers guidance on how to care for yourself after your procedure. Your health care provider may also give you more specific instructions. If you have problems or questions, contact your health care provider. What can I expect after the procedure? After the procedure, it is common to have: A small amount of blood or clear fluid coming from the incision. Some redness, swelling, and pain in your incision area. Some abdominal pain and soreness. Vaginal bleeding (lochia). Even though you did not have a vaginal delivery, you will still have vaginal bleeding and discharge. Pelvic cramps. Fatigue. You may have pain, swelling, and discomfort in the tissue between your vagina and your anus (perineum) if: Your C-section was unplanned, and you were allowed to labor and push. An incision was made in the area (episiotomy) or the tissue tore during attempted vaginal delivery. Follow these instructions at home: Medicines Take over-the-counter and prescription medicines only as told by your health care provider. If you were prescribed an antibiotic medicine, take it as told by your health care provider. Do not stop taking the antibiotic even if you start to feel better. Ask your health care provider if the medicine prescribed to you requires you to avoid driving or using machinery. Incision care  Follow instructions from your health care provider about how to take care of your incision. Make sure you: Wash your hands with soap and water for an least 20 seconds before and after you change your bandage (dressing). If soap and water are not available, use hand sanitizer. If you have a dressing, change it or remove it as told by your health care provider. Leave stitches (sutures), skin staples, skin glue, or adhesive strips in place. These skin closures may need to stay in place for 2 weeks or longer. If adhesive strip edges start to loosen and curl up, you  may trim the loose edges. Do not remove adhesive strips completely unless your health care provider tells you to do that. Check your incision area every day for signs of infection. Check for: More redness, swelling, or pain. More fluid or blood. Warmth. Pus or a bad smell. Do not take baths, swim, or use a hot tub until your health care provider approves. Ask your health care provider if you may take showers. When you cough or sneeze, hug a pillow. This helps with pain and decreases the chance of your incision opening up (dehiscing). Do this until your incision heals. Managing constipation Your procedure may cause constipation. To prevent or treat constipation, you may need to: Drink enough fluid to keep your urine pale yellow. Take over-the-counter or prescription medicines. Eat foods that are high in fiber, such as beans, whole grains, and fresh fruits and vegetables. Limit foods that are high in fat and processed sugars, such as fried or sweet foods. Activity  If possible, have someone help you care for your baby and help with household activities for at least a few days after you leave the hospital. Rest as much as possible. Try to rest or take a nap while your baby is sleeping. You may have to avoid lifting. Ask your health care provider how much you can safely lift. Return to your normal activities as told by your health care provider. Ask your health care provider what activities are safe for you. Talk with your health care provider about when you can engage in sexual activity. This may depend on your: Risk of infection. How fast you heal. Comfort  and desire to engage in sexual activity. Lifestyle Do not drink alcohol. This is especially important if you are breastfeeding or taking pain medicine. Do not use any products that contain nicotine or tobacco. These products include cigarettes, chewing tobacco, and vaping devices, such as e-cigarettes. If you need help quitting, ask your  health care provider. General instructions Do not use tampons or douches until your health care provider approves. Wear loose, comfortable clothing and a supportive and well-fitting bra. If you pass a blood clot, save it and call your health care provider to discuss. Do not flush blood clots down the toilet before you get instructions from your health care provider. Keep all follow-up visits for you and your baby. This is important. Contact a health care provider if: You have: A fever. Dizziness or light-headedness. Bad-smelling vaginal discharge. A blood clot pass from your vagina. Pus, blood, or a bad smell coming from your incision. An incision that feels warm to the touch. More redness, swelling, or pain around your incision. Difficulty or pain when urinating. Nausea or vomiting. Little or no interest in activities you used to enjoy. Your breasts turn red or become painful or hard. You feel unusually sad or worried. You have questions about caring for yourself or your baby. You have redness, swelling, and pain in an arm or leg. Get help right away if: You have: Pain that does not go away or get better with medicine. Chest pain. Trouble breathing. Blurred vision, spots, or flashing lights in your vision. Thoughts about hurting yourself or your baby. New pain in your abdomen or in one of your legs. A severe headache that does not get better with pain medicine. You faint. You bleed from your vagina so much that you fill more than one sanitary pad in one hour. Bleeding should not be heavier than your heaviest period. These symptoms may be an emergency. Get help right away. Call 911. Do not wait to see if the symptoms will go away. Do not drive yourself to the hospital. Get help right away if you feel like you may hurt yourself or others, or have thoughts about taking your own life. Go to your nearest emergency room or: Call 911. Call the [facility] at  [phone] or 988. This is open 24 hours a day. Text the Crisis Text Line at 908-587-6669. Summary After the procedure, it is common to have pain at your incision site, abdominal cramping, and slight bleeding from your vagina. Check your incision area every day for signs of infection. Tell your health care provider about any unusual symptoms. Keep all follow-up visits for you and your baby. This is important. This information is not intended to replace advice given to you by your health care provider. Make sure you discuss any questions you have with your health care provider. Document Revised: 07/17/2021 Document Reviewed: 07/17/2021 Elsevier Patient Education  [place].

## 2023-02-26 NOTE — Patient Instructions (Signed)

## 2023-02-26 NOTE — Progress Notes (Addendum)
ROB and NST for circumvallate placenta. Pt has schedule cesarean section tomorrow for breech presentation. Discussed procedure and what to expect. Pt and her partner verbalizes understanding.   NST : reactive, category 1 strip  Baseline 120 Accelerations present Decelerations absent Moderate variability  Toco; irritability   Philip Aspen, CNM

## 2023-02-26 NOTE — Addendum Note (Signed)
Addended by: Minette Headland on: 02/26/2023 01:28 PM   Modules accepted: Orders

## 2023-02-27 ENCOUNTER — Encounter: Payer: Self-pay | Admitting: Obstetrics and Gynecology

## 2023-02-27 ENCOUNTER — Encounter
Admission: RE | Disposition: A | Discharge status: Home / Self Care | Payer: Self-pay | Source: Ambulatory Visit | Attending: Obstetrics and Gynecology

## 2023-02-27 ENCOUNTER — Inpatient Hospital Stay
Admission: RE | Admit: 2023-02-27 | Discharge: 2023-02-27 | Discharge status: Against Medical Advice | Payer: Managed Care, Other (non HMO) | Source: Ambulatory Visit | Attending: Obstetrics and Gynecology | Admitting: Obstetrics and Gynecology

## 2023-02-27 ENCOUNTER — Inpatient Hospital Stay: Payer: Managed Care, Other (non HMO) | Admitting: Registered Nurse

## 2023-02-27 ENCOUNTER — Inpatient Hospital Stay
Admission: RE | Admit: 2023-02-27 | Discharge: 2023-03-01 | DRG: 787 | Disposition: A | Discharge status: Home / Self Care | Payer: Managed Care, Other (non HMO) | Source: Ambulatory Visit | Attending: Obstetrics and Gynecology | Admitting: Obstetrics and Gynecology

## 2023-02-27 DIAGNOSIS — O9903 Anemia complicating the puerperium: Secondary | ICD-10-CM | POA: Diagnosis not present

## 2023-02-27 DIAGNOSIS — O9902 Anemia complicating childbirth: Secondary | ICD-10-CM | POA: Diagnosis present

## 2023-02-27 DIAGNOSIS — Z3A39 39 weeks gestation of pregnancy: Secondary | ICD-10-CM | POA: Diagnosis not present

## 2023-02-27 DIAGNOSIS — Z9889 Other specified postprocedural states: Principal | ICD-10-CM

## 2023-02-27 DIAGNOSIS — O43113 Circumvallate placenta, third trimester: Secondary | ICD-10-CM | POA: Diagnosis present

## 2023-02-27 DIAGNOSIS — O321XX0 Maternal care for breech presentation, not applicable or unspecified: Secondary | ICD-10-CM | POA: Diagnosis present

## 2023-02-27 DIAGNOSIS — D62 Acute posthemorrhagic anemia: Secondary | ICD-10-CM | POA: Diagnosis not present

## 2023-02-27 HISTORY — DX: Anemia, unspecified: D64.9

## 2023-02-27 HISTORY — DX: Family history of other specified conditions: Z84.89

## 2023-02-27 HISTORY — DX: Gastro-esophageal reflux disease without esophagitis: K21.9

## 2023-02-27 HISTORY — DX: Other specified postprocedural states: Z98.890

## 2023-02-27 LAB — TYPE AND SCREEN
ABO/RH(D): B NEG
ABO/RH(D): B NEG
Antibody Screen: POSITIVE
Antibody Screen: POSITIVE
Extend sample reason: UNDETERMINED

## 2023-02-27 SURGERY — Surgical Case
Anesthesia: Spinal

## 2023-02-27 MED ORDER — PHENYLEPHRINE HCL-NACL 20-0.9 MG/250ML-% IV SOLN
INTRAVENOUS | Status: AC
  Filled 2023-02-27: qty 250

## 2023-02-27 MED ORDER — ONDANSETRON HCL 4 MG/2ML IJ SOLN
4.0000 mg | Freq: Three times a day (TID) | INTRAMUSCULAR | Status: DC | PRN
  Administered 2023-02-27: 4 mg via INTRAVENOUS
  Filled 2023-02-27: qty 2

## 2023-02-27 MED ORDER — SIMETHICONE 80 MG PO CHEW
80.0000 mg | CHEWABLE_TABLET | Freq: Four times a day (QID) | ORAL | Status: DC
  Administered 2023-02-27 – 2023-03-01 (×7): 80 mg via ORAL
  Filled 2023-02-27 (×7): qty 1

## 2023-02-27 MED ORDER — LIDOCAINE 5 % EX PTCH
MEDICATED_PATCH | CUTANEOUS | Status: AC
  Filled 2023-02-27: qty 1

## 2023-02-27 MED ORDER — NALOXONE HCL 0.4 MG/ML IJ SOLN: 0.4000 mg | INTRAMUSCULAR | Status: DC | PRN

## 2023-02-27 MED ORDER — ONDANSETRON HCL 4 MG/2ML IJ SOLN
INTRAMUSCULAR | Status: DC | PRN
  Administered 2023-02-27: 4 mg via INTRAVENOUS

## 2023-02-27 MED ORDER — SODIUM CHLORIDE 0.9 % IV SOLN
12.5000 mg | Freq: Once | INTRAVENOUS | Status: DC | PRN
  Filled 2023-02-27: qty 0.5

## 2023-02-27 MED ORDER — DIPHENHYDRAMINE HCL 50 MG/ML IJ SOLN: 12.5000 mg | INTRAMUSCULAR | Status: DC | PRN

## 2023-02-27 MED ORDER — OXYCODONE-ACETAMINOPHEN 5-325 MG PO TABS: 1.0000 | ORAL_TABLET | ORAL | Status: DC | PRN

## 2023-02-27 MED ORDER — MENTHOL 3 MG MT LOZG: 1.0000 | LOZENGE | OROMUCOSAL | Status: DC | PRN

## 2023-02-27 MED ORDER — LACTATED RINGERS IV SOLN: INTRAVENOUS | Status: DC | PRN

## 2023-02-27 MED ORDER — BUPIVACAINE IN DEXTROSE 0.75-8.25 % IT SOLN
INTRATHECAL | Status: DC | PRN
  Administered 2023-02-27: 1.6 mL via INTRATHECAL

## 2023-02-27 MED ORDER — MORPHINE SULFATE (PF) 0.5 MG/ML IJ SOLN
INTRAMUSCULAR | Status: AC
  Filled 2023-02-27: qty 10

## 2023-02-27 MED ORDER — OXYTOCIN-SODIUM CHLORIDE 30-0.9 UT/500ML-% IV SOLN
INTRAVENOUS | Status: DC | PRN
  Administered 2023-02-27: 500 mL via INTRAVENOUS

## 2023-02-27 MED ORDER — SENNOSIDES-DOCUSATE SODIUM 8.6-50 MG PO TABS
2.0000 | ORAL_TABLET | ORAL | Status: DC
  Administered 2023-02-27 – 2023-03-01 (×3): 2 via ORAL
  Filled 2023-02-27 (×3): qty 2

## 2023-02-27 MED ORDER — KETOROLAC TROMETHAMINE 30 MG/ML IJ SOLN
INTRAMUSCULAR | Status: DC | PRN
  Administered 2023-02-27: 30 mg via INTRAVENOUS

## 2023-02-27 MED ORDER — DEXAMETHASONE SODIUM PHOSPHATE 10 MG/ML IJ SOLN
INTRAMUSCULAR | Status: DC | PRN
  Administered 2023-02-27: 10 mg via INTRAVENOUS

## 2023-02-27 MED ORDER — DIPHENHYDRAMINE HCL 25 MG PO CAPS: 25.0000 mg | ORAL_CAPSULE | ORAL | Status: DC | PRN

## 2023-02-27 MED ORDER — ACETAMINOPHEN 500 MG PO TABS
1000.0000 mg | ORAL_TABLET | Freq: Four times a day (QID) | ORAL | Status: AC
  Administered 2023-02-27 – 2023-02-28 (×4): 1000 mg via ORAL
  Filled 2023-02-27 (×4): qty 2

## 2023-02-27 MED ORDER — ONDANSETRON HCL 4 MG/2ML IJ SOLN: 4.0000 mg | Freq: Once | INTRAMUSCULAR | Status: DC | PRN

## 2023-02-27 MED ORDER — PHENYLEPHRINE HCL-NACL 20-0.9 MG/250ML-% IV SOLN
INTRAVENOUS | Status: DC | PRN
  Administered 2023-02-27: 40 ug/min via INTRAVENOUS

## 2023-02-27 MED ORDER — DIPHENHYDRAMINE HCL 25 MG PO CAPS: 25.0000 mg | ORAL_CAPSULE | Freq: Four times a day (QID) | ORAL | Status: DC | PRN

## 2023-02-27 MED ORDER — FENTANYL CITRATE (PF) 100 MCG/2ML IJ SOLN
INTRAMUSCULAR | Status: AC
  Filled 2023-02-27: qty 2

## 2023-02-27 MED ORDER — CHLORHEXIDINE GLUCONATE 0.12 % MT SOLN
OROMUCOSAL | Status: AC
  Administered 2023-02-27: 15 mL
  Filled 2023-02-27: qty 15

## 2023-02-27 MED ORDER — PHENYLEPHRINE 80 MCG/ML (10ML) SYRINGE FOR IV PUSH (FOR BLOOD PRESSURE SUPPORT)
PREFILLED_SYRINGE | INTRAVENOUS | Status: DC | PRN
  Administered 2023-02-27 (×7): 80 ug via INTRAVENOUS

## 2023-02-27 MED ORDER — PRENATAL MULTIVITAMIN CH
1.0000 | ORAL_TABLET | Freq: Every day | ORAL | Status: DC
  Administered 2023-02-28 – 2023-03-01 (×2): 1 via ORAL
  Filled 2023-02-27 (×2): qty 1

## 2023-02-27 MED ORDER — ZOLPIDEM TARTRATE 5 MG PO TABS: 5.0000 mg | ORAL_TABLET | Freq: Every evening | ORAL | Status: DC | PRN

## 2023-02-27 MED ORDER — MEPERIDINE HCL 25 MG/ML IJ SOLN: 6.2500 mg | INTRAMUSCULAR | Status: DC | PRN

## 2023-02-27 MED ORDER — LIDOCAINE HCL (PF) 1 % IJ SOLN
INTRAMUSCULAR | Status: DC | PRN
  Administered 2023-02-27: 3 mL

## 2023-02-27 MED ORDER — OXYTOCIN-SODIUM CHLORIDE 30-0.9 UT/500ML-% IV SOLN: 2.5000 [IU]/h | INTRAVENOUS | Status: AC

## 2023-02-27 MED ORDER — CEFAZOLIN SODIUM-DEXTROSE 2-4 GM/100ML-% IV SOLN
INTRAVENOUS | Status: AC
  Filled 2023-02-27: qty 100

## 2023-02-27 MED ORDER — MORPHINE SULFATE (PF) 0.5 MG/ML IJ SOLN
INTRAMUSCULAR | Status: DC | PRN
  Administered 2023-02-27: 100 ug via INTRATHECAL

## 2023-02-27 MED ORDER — IBUPROFEN 600 MG PO TABS
600.0000 mg | ORAL_TABLET | Freq: Four times a day (QID) | ORAL | Status: DC
  Administered 2023-02-27 – 2023-02-28 (×4): 600 mg via ORAL
  Filled 2023-02-27 (×5): qty 1

## 2023-02-27 MED ORDER — OXYTOCIN-SODIUM CHLORIDE 30-0.9 UT/500ML-% IV SOLN
INTRAVENOUS | Status: AC
  Filled 2023-02-27: qty 1000

## 2023-02-27 MED ORDER — CEFAZOLIN SODIUM-DEXTROSE 2-3 GM-%(50ML) IV SOLR
INTRAVENOUS | Status: DC | PRN
  Administered 2023-02-27: 2 g via INTRAVENOUS

## 2023-02-27 MED ORDER — FENTANYL CITRATE (PF) 100 MCG/2ML IJ SOLN
INTRAMUSCULAR | Status: DC | PRN
  Administered 2023-02-27: 15 ug via INTRATHECAL

## 2023-02-27 MED ORDER — SODIUM CHLORIDE 0.9% FLUSH: 3.0000 mL | INTRAVENOUS | Status: DC | PRN

## 2023-02-27 MED ORDER — SOD CITRATE-CITRIC ACID 500-334 MG/5ML PO SOLN
ORAL | Status: AC
  Administered 2023-02-27: 30 mL
  Filled 2023-02-27: qty 15

## 2023-02-27 MED ORDER — FENTANYL CITRATE (PF) 100 MCG/2ML IJ SOLN: 25.0000 ug | INTRAMUSCULAR | Status: DC | PRN

## 2023-02-27 MED ORDER — NALOXONE HCL 4 MG/10ML IJ SOLN: 1.0000 ug/kg/h | INTRAVENOUS | Status: DC | PRN

## 2023-02-27 MED ORDER — LACTATED RINGERS IV SOLN: INTRAVENOUS | Status: DC

## 2023-02-27 SURGICAL SUPPLY — 28 items
ADH LQ OCL WTPRF AMP STRL LF (MISCELLANEOUS) ×1
ADHESIVE MASTISOL STRL (MISCELLANEOUS) ×2 IMPLANT
APL PRP STRL LF DISP 70% ISPRP (MISCELLANEOUS) ×2
BAG COUNTER SPONGE SURGICOUNT (BAG) ×2 IMPLANT
BAG SPNG CNTER NS LX DISP (BAG) ×1
CHLORAPREP W/TINT 26 (MISCELLANEOUS) ×4 IMPLANT
DRSG TELFA 3X8 NADH STRL (GAUZE/BANDAGES/DRESSINGS) ×2 IMPLANT
GAUZE SPONGE 4X4 12PLY STRL (GAUZE/BANDAGES/DRESSINGS) ×2 IMPLANT
GLOVE PI ORTHO PRO STRL 7.5 (GLOVE) ×2 IMPLANT
GOWN STRL REUS W/ TWL LRG LVL3 (GOWN DISPOSABLE) ×4 IMPLANT
GOWN STRL REUS W/TWL LRG LVL3 (GOWN DISPOSABLE) ×2
KIT TURNOVER KIT A (KITS) ×2 IMPLANT
MANIFOLD NEPTUNE II (INSTRUMENTS) ×2 IMPLANT
MAT PREVALON FULL STRYKER (MISCELLANEOUS) ×2 IMPLANT
NS IRRIG 1000ML POUR BTL (IV SOLUTION) ×2 IMPLANT
PACK C SECTION AR (MISCELLANEOUS) ×2 IMPLANT
PAD OB MATERNITY 4.3X12.25 (PERSONAL CARE ITEMS) ×2 IMPLANT
PAD PREP 24X41 OB/GYN DISP (PERSONAL CARE ITEMS) ×2 IMPLANT
RETRACTOR WND ALEXIS-O 25 LRG (MISCELLANEOUS) ×2 IMPLANT
RTRCTR WOUND ALEXIS O 25CM LRG (MISCELLANEOUS) ×1
SCRUB CHG 4% DYNA-HEX 4OZ (MISCELLANEOUS) ×2 IMPLANT
SPONGE T-LAP 18X18 ~~LOC~~+RFID (SPONGE) ×2 IMPLANT
SUT VIC AB 0 CTX 36 (SUTURE) ×2
SUT VIC AB 0 CTX36XBRD ANBCTRL (SUTURE) ×4 IMPLANT
SUT VIC AB 1 CT1 36 (SUTURE) ×4 IMPLANT
SUT VICRYL+ 3-0 36IN CT-1 (SUTURE) ×4 IMPLANT
TRAP FLUID SMOKE EVACUATOR (MISCELLANEOUS) ×2 IMPLANT
WATER STERILE IRR 500ML POUR (IV SOLUTION) ×2 IMPLANT

## 2023-02-27 NOTE — H&P (Signed)
History and Physical   HPI  Linda Madden is a 32 y.o. G3P0020 at 19w0dEstimated Date of Delivery: 03/06/23 who is being admitted for C-section for breech.    OB History  OB History  Gravida Para Term Preterm AB Living  3 0 0 0 2 0  SAB IAB Ectopic Multiple Live Births  1 0 1 0 0    # Outcome Date GA Lbr Len/2nd Weight Sex Delivery Anes PTL Lv  3 Current           2 SAB 05/12/22          1 Ectopic 02/07/22            PROBLEM LIST  Pregnancy complications or risks: Patient Active Problem List   Diagnosis Date Noted   [redacted] weeks gestation of pregnancy 02/26/2023   [redacted] weeks gestation of pregnancy 02/19/2023   Supervision of normal pregnancy 02/19/2023   AFI (amniotic fluid index) borderline low 02/05/2023   Breech presentation, no version 02/05/2023   Circumvallate placenta in third trimester 02/05/2023   Supervision of other normal pregnancy, antepartum 08/07/2022    Prenatal labs and studies: ABO, Rh: --/--/B NEG (02/29 1429) Antibody: POS (02/29 1429) Rubella: 1.62 (08/11 0824) RPR: Non Reactive (12/19 0957)  HBsAg: Negative (08/11 0824)  HIV: Non Reactive (08/11 0824)  [id number]- (02/08 [id number]   Past Medical History:  Diagnosis Date   Anemia    Family history of adverse reaction to anesthesia    nausea and vomiting   GERD (gastroesophageal reflux disease)      Past Surgical History:  Procedure Laterality Date   LASIK     WISDOM TOOTH EXTRACTION       Medications    Discharge Medication List as of 02/27/2023  5:45 AM     CONTINUE these medications which have NOT CHANGED   Details  Choline Bitartrate ER 300 MG TBCR Take 1 tablet by mouth daily., Historical Med    D3-50 1.25 MG (50000 UT) capsule Take 50,000 Units by mouth once a week., Starting Sun 08/03/2022, Historical Med    Folic Acid (FOLATE PO) Take 1,000 mcg by mouth daily., Historical Med    omeprazole (PRILOSEC) 20 MG capsule Take 20 mg by mouth daily., Starting Tue  06/24/2022, Historical Med    OVER THE COUNTER MEDICATION Take 200 mg by mouth daily. CO-Q-10, Historical Med    Prenatal Vit-Fe Fumarate-FA (MULTIVITAMIN-PRENATAL) 27-0.8 MG TABS tablet Take 1 tablet by mouth daily at 12 noon., Historical Med    SYNTHROID 75 MCG tablet Take 75 mcg by mouth every morning., Starting Mon 07/21/2022, Historical Med         Allergies  Patient has no known allergies.  Review of Systems  Pertinent items noted in HPI and remainder of comprehensive ROS otherwise negative.  Physical Exam  Ht '5\' 5"'$  (1.651 m)   Wt 83.5 kg   LMP 05/30/2022 (Exact Date)   BMI 30.62 kg/m   Lungs:  CTA B Cardio: RRR without M/R/G Abd: Soft, gravid, NT Presentation: breech  EXT: No C/C/ 1+ Edema DTRs: 2+ B CERVIX:    See Prenatal records for more detailed PE.     FHR:  Variability: Good {> 6 bpm)  Toco: Uterine Contractions: None  Test Results  Results for orders placed or performed during the hospital encounter of 02/26/23 (from the past 24 hour(s))  CBC     Status: Abnormal   Collection Time: 02/26/23  2:29 PM  Result Value  Ref Range   WBC 9.8 4.0 - 10.5 K/uL   RBC 3.65 (L) 3.87 - 5.11 MIL/uL   Hemoglobin 10.8 (L) 12.0 - 15.0 g/dL   HCT 31.9 (L) 36.0 - 46.0 %   MCV 87.4 80.0 - 100.0 fL   MCH 29.6 26.0 - 34.0 pg   MCHC 33.9 30.0 - 36.0 g/dL   RDW 13.5 11.5 - 15.5 %   Platelets 233 150 - 400 K/uL   nRBC 0.0 0.0 - 0.2 %  Type and screen [place]     Status: None   Collection Time: 02/26/23  2:29 PM  Result Value Ref Range   ABO/RH(D) B NEG    Antibody Screen POS    Sample Expiration 03/01/2023,2359    Extend sample reason PREGNANT WITHIN 3 MONTHS, UNABLE TO EXTEND    Antibody Identification      PASSIVELY ACQUIRED ANTI-D Performed at [facility], [address], [place], [place] [address]      Assessment   [phone] at 68w0dEstimated Date of Delivery: 03/06/23  The fetus is reassuring.   Patient Active  Problem List   Diagnosis Date Noted   [redacted] weeks gestation of pregnancy 02/26/2023   [redacted] weeks gestation of pregnancy 02/19/2023   Supervision of normal pregnancy 02/19/2023   AFI (amniotic fluid index) borderline low 02/05/2023   Breech presentation, no version 02/05/2023   Circumvallate placenta in third trimester 02/05/2023   Supervision of other normal pregnancy, antepartum 08/07/2022    Plan  1. Admit to L&D :   2. EFM: -- Category 1 3. CD  DFinis Bud M.D. 02/27/2023 7:35 AM

## 2023-02-27 NOTE — Op Note (Signed)
     OP NOTE  Date: 02/27/2023   9:59 AM Name Linda Madden MR# [id number]  Preoperative Diagnosis: 1. Intrauterine pregnancy at 104w0dActive Problems:   * No active hospital problems. *  2.  malpresentation: Breech  Postoperative Diagnosis: 1. Intrauterine pregnancy at 376w0ddelivered 2. Viable infant 3. Remainder same as pre-op   Procedure: 1. Primary Low-Transverse Cesarean Section  Surgeon: DaFinis BudMD  Assistant:  GlCecilie KicksNo other capable assistant was available for this surgery which requires an experienced, high level assistant.   Anesthesia: Spinal    EBL: 425 ml     Findings: 1) female infant, Apgar scores of 8   at 1 minute and 9   at 5 minutes and a birthweight of 106.17  ounces.    2) Normal uterus, tubes and ovaries.    Procedure:  The patient was prepped and draped in the supine position and placed under spinal anesthesia.  A transverse incision was made across the abdomen in a Pfannenstiel manner. If indicated the old scar was systematically removed with sharp dissection.  We carried the dissection down to the level of the fascia.  The fascia was incised in a curvilinear manner.  The fascia was then elevated from the rectus muscles with blunt and sharp dissection.  The rectus muscles were separated laterally exposing the peritoneum.  The peritoneum was carefully entered with care being taken to avoid bowel and bladder.  A self-retaining retractor was placed.  The visceral peritoneum was incised in a curvilinear fashion across the lower uterine segment creating a bladder flap. A transverse incision was made across the lower uterine segment and extended laterally and superiorly using the bandage scissors.  Artificial rupture membranes was performed and Clear fluid was noted.  The infant was delivered from the breech frank position.  A nuchal cord was not present. After an appropriate time interval, the cord was doubly clamped and cut. Cord blood  was obtained if required.  The infant was handed to the pediatric personnel  who then placed the infant under heat lamps where it was cleaned dried and suctioned as needed. The placenta was delivered. The hysterotomy incision was then identified on ring forceps.  The uterine cavity was cleaned with a moist lap sponge.  The hysterotomy incision was closed with a running interlocking suture of Vicryl.  Hemostasis was excellent.  Pitocin was run in the IV and the uterus was found to be firm. The posterior cul-de-sac and gutters were cleaned and inspected.  Hemostasis was noted.  The fascia was then closed with a running suture of #1 Vicryl.  Hemostasis of the subcutaneous tissues was obtained using the Bovie.  The subcutaneous tissues were closed with a running suture of 000 Vicryl.  A subcuticular suture was placed.  Steri-strips were applied in the usual manner.  A Lidoderm patch was applied.  A pressure dressing was placed.  The patient went to the recovery room in stable condition.  [place] CNM  provided exposure, dissection, suctioning, retraction, and general support and assistance during the procedure.  DaFinis BudM.D. 02/27/2023 9:59 AM

## 2023-02-27 NOTE — Transfer of Care (Signed)
Immediate Anesthesia Transfer of Care Note  Patient: Linda Madden  Procedure(s) Performed: CESAREAN SECTION  Patient Location: Mother/Baby  Anesthesia Type:Spinal  Level of Consciousness: awake, alert , and oriented  Airway & Oxygen Therapy: Patient Spontanous Breathing  Post-op Assessment: Report given to RN and Post -op Vital signs reviewed and stable  Post vital signs: Reviewed and stable  Last Vitals:  Vitals Value Taken Time  BP 97/61   Temp    Pulse 77   Resp 12   SpO2 99     Last Pain:  Vitals:   02/27/23 0615  TempSrc: Oral         Complications: No notable events documented.

## 2023-02-27 NOTE — Lactation Note (Signed)
This note was copied from a baby's chart. Lactation Consultation Note  Patient Name: Linda Madden Today's Date: 02/27/2023 Age:31 hours Reason for consult: L&D Initial assessment;Primapara;1st time breastfeeding;Term;Breastfeeding assistance   Maternal Data Has patient been taught Hand Expression?: Yes Does the patient have breastfeeding experience prior to this delivery?: No  Feeding Mother's Current Feeding Choice: Breast Milk  LATCH Score Latch: Grasps breast easily, tongue down, lips flanged, rhythmical sucking.  Audible Swallowing: A few with stimulation  Type of Nipple: Everted at rest and after stimulation  Comfort (Breast/Nipple): Soft / non-tender  Hold (Positioning): Assistance needed to correctly position infant at breast and maintain latch.  LATCH Score: 8   Lactation Tools Discussed/Used    Interventions Interventions: Breast feeding basics reviewed;Assisted with latch;Skin to skin;Hand express;Support pillows;Education  Discharge Pump: Personal WIC Program: No  Consult Status Consult Status: Follow-up from L&D    Ferol Luz 02/27/2023, 10:30 AM

## 2023-02-27 NOTE — Anesthesia Preprocedure Evaluation (Signed)
Anesthesia Evaluation  Patient identified by MRN, date of birth, ID band Patient awake    Reviewed: Allergy & Precautions, H&P , NPO status , Patient's Chart, lab work & pertinent test results, reviewed documented beta blocker date and time   Airway Mallampati: II  TM Distance: >3 FB Neck ROM: full    Dental no notable dental hx. (+) Teeth Intact   Pulmonary neg pulmonary ROS   Pulmonary exam normal breath sounds clear to auscultation       Cardiovascular Exercise Tolerance: Good negative cardio ROS  Rhythm:regular Rate:Normal     Neuro/Psych negative neurological ROS  negative psych ROS   GI/Hepatic Neg liver ROS,GERD  Medicated,,  Endo/Other  negative endocrine ROSdiabetes, Well Controlled, Gestational    Renal/GU      Musculoskeletal   Abdominal   Peds  Hematology  (+) Blood dyscrasia, anemia   Anesthesia Other Findings   Reproductive/Obstetrics (+) Pregnancy                             Anesthesia Physical Anesthesia Plan  ASA: 2  Anesthesia Plan: Spinal   Post-op Pain Management:    Induction:   PONV Risk Score and Plan: 2  Airway Management Planned:   Additional Equipment:   Intra-op Plan:   Post-operative Plan:   Informed Consent: I have reviewed the patients History and Physical, chart, labs and discussed the procedure including the risks, benefits and alternatives for the proposed anesthesia with the patient or authorized representative who has indicated his/her understanding and acceptance.       Plan Discussed with:   Anesthesia Plan Comments:        Anesthesia Quick Evaluation

## 2023-02-27 NOTE — H&P (Signed)
  The note originally documented on this encounter has been moved the the encounter in which it belongs.  

## 2023-02-27 NOTE — Discharge Instructions (Signed)

## 2023-02-28 LAB — CBC
HCT: 31 % — ABNORMAL LOW (ref 36.0–46.0)
Hemoglobin: 10.4 g/dL — ABNORMAL LOW (ref 12.0–15.0)
MCH: 29.2 pg (ref 26.0–34.0)
MCHC: 33.5 g/dL (ref 30.0–36.0)
MCV: 87.1 fL (ref 80.0–100.0)
Platelets: 232 10*3/uL (ref 150–400)
RBC: 3.56 MIL/uL — ABNORMAL LOW (ref 3.87–5.11)
RDW: 13.2 % (ref 11.5–15.5)
WBC: 13.1 10*3/uL — ABNORMAL HIGH (ref 4.0–10.5)
nRBC: 0 % (ref 0.0–0.2)

## 2023-02-28 MED ORDER — BACITRACIN-NEOMYCIN-POLYMYXIN OINTMENT TUBE: TOPICAL_OINTMENT | CUTANEOUS | Status: DC | PRN

## 2023-02-28 MED ORDER — LEVOTHYROXINE SODIUM 75 MCG PO TABS
75.0000 ug | ORAL_TABLET | Freq: Every day | ORAL | Status: DC
  Filled 2023-02-28 (×2): qty 1

## 2023-02-28 MED ORDER — IBUPROFEN 600 MG PO TABS
600.0000 mg | ORAL_TABLET | Freq: Four times a day (QID) | ORAL | Status: DC
  Administered 2023-03-01 (×3): 600 mg via ORAL
  Filled 2023-02-28 (×3): qty 1

## 2023-02-28 MED ORDER — VARICELLA VIRUS VACCINE LIVE 1350 PFU/0.5ML IJ SUSR
0.5000 mL | Freq: Once | INTRAMUSCULAR | Status: DC
  Filled 2023-02-28: qty 0.5

## 2023-02-28 NOTE — Lactation Note (Addendum)
This note was copied from a baby's chart. Lactation Consultation Note  Patient Name: Linda Madden [id number] Date: 02/28/2023 Age:31 hours Reason for consult: Follow-up assessment;Term;Primapara  Mother of baby did not have any questions. Mother of baby stated that the infant's latch would become shallow causing nipple pain and would re-latch. Educated mom on supporting infant with pillows in football hold and bringing baby to breast to obtain a deep latch.   Discussed the following:  -Hunger cues -Voids and stools expectations -When and how to call for help -How to obtain a deep latch -Feeding frequency (q3hr or 8-12x/24hr following infants cues) -Positioning  -Emphasized rest, hydration and diet.     Maternal Data [phone], c/s. Mother of baby has a history of anemia.  Has patient been taught Hand Expression?: Yes Does the patient have breastfeeding experience prior to this delivery?: No  Feeding Mother's Current Feeding Choice: Breast Milk  Interventions Interventions: Breast feeding basics reviewed;Education  Consult Status Consult Status: Follow-up Date: 03/01/23 Follow-up type: In-patient    Linda Madden 02/28/2023, 11:21 AM

## 2023-02-28 NOTE — Lactation Note (Addendum)
This note was copied from a baby's chart. Lactation Consultation Note  Patient Name: Girl Jesseka Swoveland [id number] Date: 02/28/2023 Age:31 hours Reason for consult: Mother's request;Primapara;Difficult latch  Mother of baby requested assistance with latch on left breast.  Upon entering room infant was feeding on left breast. Infant was not stomach to stomach with mother of baby. Adjusted infant's positioning and pillows to better support infant and mom in football hold. Educated mother on tapping infants lip to achieve a wide opened mouth to obtain a deep latch and to bring baby to breast. Mother of baby expressed that the adjusted latch felt better. Support person added a rolled blanket under infant to help support head at breast level. Per support person infant fed on right breast for 18 minutes. Feeding was still going as [employer] student left room.  Maternal Data See previous note.   Has patient been taught Hand Expression?: Yes Does the patient have breastfeeding experience prior to this delivery?: No  Feeding Mother's Current Feeding Choice: Breast Milk  LATCH Score Latch: Grasps breast easily, tongue down, lips flanged, rhythmical sucking.  Audible Swallowing: Spontaneous and intermittent  Type of Nipple: Everted at rest and after stimulation  Comfort (Breast/Nipple): Soft / non-tender  Hold (Positioning): Assistance needed to correctly position infant at breast and maintain latch.  LATCH Score: 9   Interventions Interventions: Adjust position;Support pillows;Assisted with latch   Consult Status Consult Status: Follow-up Date: 03/01/23 Follow-up type: In-patient    Christopherjame Carnell 02/28/2023, 12:21 PM

## 2023-02-28 NOTE — Progress Notes (Signed)
Progress Note - Cesarean Delivery  Linda Madden is a 32 y.o. [phone] now PP day 1 s/p C-Section, Low Transverse .   Subjective:  Patient reports no problems with eating, bowel movements, voiding, or their wound  Breastfeeding  Objective:  Vital signs in last 24 hours: Temp:  [97.5 F (36.4 C)-98.7 F (37.1 C)] 97.9 F (36.6 C) (03/02 1319) Pulse Rate:  [72-96] 72 (03/02 1319) Resp:  [14-19] 14 (03/02 1319) BP: (98-108)/(62-75) 108/75 (03/02 1319) SpO2:  [94 %-99 %] 99 % (03/02 1319)  Physical Exam:  General: alert, cooperative, and no distress Lochia: appropriate Uterine Fundus: firm Incision: Honeycomb in place    Data Review Recent Labs    02/26/23 1429 02/28/23 0526  HGB 10.8* 10.4*  HCT 31.9* 31.0*    Assessment:  Principal Problem:   Post-operative state   Status post Cesarean section. Doing well postoperatively.     Plan:       Continue current care.  Probable discharge tomorrow.  Finis Bud, M.D. 02/28/2023 3:06 PM

## 2023-02-28 NOTE — Anesthesia Postprocedure Evaluation (Signed)
Anesthesia Post Note  Patient: Linda Madden  Procedure(s) Performed: [place]  Patient location during evaluation: Mother Baby Anesthesia Type: Spinal Level of consciousness: awake and alert Pain management: pain level controlled Vital Signs Assessment: post-procedure vital signs reviewed and stable Respiratory status: spontaneous breathing, nonlabored ventilation and respiratory function stable Cardiovascular status: stable Postop Assessment: no headache, no backache, no apparent nausea or vomiting and able to ambulate Anesthetic complications: no   No notable events documented.   Last Vitals:  Vitals:   02/28/23 1015 02/28/23 1016  BP: 98/64 98/65  Pulse: 83 81  Resp: 16 16  Temp:  37 C  SpO2:  98%    Last Pain:  Vitals:   02/28/23 1016  TempSrc: Oral  PainSc:                  [name]

## 2023-03-01 MED ORDER — COCONUT OIL OIL
TOPICAL_OIL | Status: AC
  Filled 2023-03-01: qty 7.5

## 2023-03-01 MED ORDER — IBUPROFEN 600 MG PO TABS: 600.0000 mg | ORAL_TABLET | Freq: Four times a day (QID) | ORAL | 0 refills | Status: AC | PRN

## 2023-03-01 MED ORDER — COCONUT OIL OIL: 1.0000 | TOPICAL_OIL | Status: DC | PRN

## 2023-03-01 MED ORDER — OXYCODONE-ACETAMINOPHEN 5-325 MG PO TABS: 1.0000 | ORAL_TABLET | ORAL | 0 refills | Status: DC | PRN

## 2023-03-01 NOTE — Final Progress Note (Signed)
Obstetric Postpartum Daily Progress Note Subjective:  32 y.o. [id number] postpartum day #2 status post primary LTCS.  She is ambulating, is tolerating po, is voiding spontaneously.  Her pain is well controlled on PO pain medications. Her lochia is less than menses. Breastfeeding independently.    Medications SCHEDULED MEDICATIONS   coconut oil       ibuprofen  600 mg Oral Q6H   levothyroxine  75 mcg Oral Q0600   prenatal multivitamin  1 tablet Oral Q1200   senna-docusate  2 tablet Oral Q24H   simethicone  80 mg Oral QID   varicella virus vaccine live  0.5 mL Subcutaneous Once    MEDICATION INFUSIONS   lactated ringers Stopped (02/27/23 2139)   naloxone HCl (NARCAN) 2 mg in dextrose 5 % 250 mL infusion     promethazine (PHENERGAN) injection (IM or IVPB)      PRN MEDICATIONS  coconut oil, coconut oil, diphenhydrAMINE, menthol-cetylpyridinium, naloxone **AND** sodium chloride flush, naloxone HCl (NARCAN) 2 mg in dextrose 5 % 250 mL infusion, neomycin-bacitracin-polymyxin, ondansetron (ZOFRAN) IV, oxyCODONE-acetaminophen, promethazine (PHENERGAN) injection (IM or IVPB), zolpidem    Objective:   Vitals:   02/28/23 1319 02/28/23 1800 03/01/23 0027 03/01/23 0814  BP: 108/75 116/80 111/75 101/68  Pulse: 72 83 74 64  Resp: '14 14  14  '$ Temp: 97.9 F (36.6 C) 97.9 F (36.6 C) 98.2 F (36.8 C) 98 F (36.7 C)  TempSrc: Oral Oral Oral Oral  SpO2: 99% 98% 99% 99%  Weight:      Height:        Current Vital Signs 24h Vital Sign Ranges  T 98 F (36.7 C) Temp  Avg: 98 F (36.7 C)  Min: 97.9 F (36.6 C)  Max: 98.2 F (36.8 C)  BP 101/68 BP  Min: 101/68  Max: 116/80  HR 64 Pulse  Avg: 73.3  Min: 64  Max: 83  RR 14 Resp  Avg: 14  Min: 14  Max: 14  SaO2 99 % Room Air SpO2  Avg: 98.8 %  Min: 98 %  Max: 99 %       24 Hour I/O Current Shift I/O  Time Ins Outs No intake/output data recorded. No intake/output data recorded.  General: NAD Breasts: soft, no redness or masses. Right nipple erect  and intact bilaterally. Infant currently nursing with a good latch on Left breast.  Pulmonary: no increased work of breathing, CTAB Abdomen: non-distended, non-tender, fundus firm at level of umbilicus Incision dry and intact with honeycomb dressing Extremities: no edema, no erythema, no tenderness  Labs:  Recent Labs  Lab 02/26/23 1429 02/28/23 0526  WBC 9.8 13.1*  HGB 10.8* 10.4*  HCT 31.9* 31.0*  PLT 233 232     Assessment:   32 y.o. [id number] postpartum day # 2 status post PLTCS  Plan:   1) Acute blood loss anemia - hemodynamically stable and asymptomatic - po ferrous sulfate  2) B NEG / Rubella 1.62 (08/11 0824)/ Varicella Not immune  3) TDAP status given prenatally  4) breast /Contraception = no method  5) Disposition discharge today   [place], CNM  03/01/2023 10:32 AM

## 2023-03-01 NOTE — Progress Notes (Signed)
Discharge papers reviewed and given to patient for education reference after discharge home.  Questions answered.  Follow up appointment reviewed.

## 2023-03-01 NOTE — Discharge Summary (Signed)
Obstetrical Discharge Summary  Date of Admission: 02/27/2023 Date of Discharge: 03/01/2023  Primary OB:  OB Gyn   Gestational Age at Delivery: [redacted]w[redacted]d  Antepartum complications: none, circumvallate placenta  Reason for Admission: Primary LTCS for breech  Date of Delivery: 02/27/2023  Delivered By: Dr EAmalia HaileyDelivery Type: primary cesarean section, low transverse incision Intrapartum complications/course: None Anesthesia: spinal Placenta: Delivered and expressed via active management. Intact: yes. To pathology: no.  Laceration: n/a Episiotomy: LCT uterus EBL: 4266mBaby: Liveborn female, APGARs 8/9, weight 6lbs 10oz   Discharge Diagnosis: Delivered.   Postpartum course: Doing well. Ambulating and voiding without difficulty. Has a good appetite. Pain well managed with PRN medication. Passing flatus, has not had a BM. Breastfeeding independently. Mood is good. Her partner is present, they will have her mother available for PP support. AsFiammas ready to go home.   Discharge Vital Signs:  Current Vital Signs 24h Vital Sign Ranges  T 98 F (36.7 C) Temp  Avg: 98 F (36.7 C)  Min: 97.9 F (36.6 C)  Max: 98.2 F (36.8 C)  BP 101/68 BP  Min: 101/68  Max: 116/80  HR 64 Pulse  Avg: 73.3  Min: 64  Max: 83  RR 14 Resp  Avg: 14  Min: 14  Max: 14  SaO2 99 % Room Air SpO2  Avg: 98.8 %  Min: 98 %  Max: 99 %       24 Hour I/O Current Shift I/O  Time Ins Outs No intake/output data recorded. No intake/output data recorded.    Discharge Exam:  NAD Abdomen: firm fundus below the umbilicus, NTTP, non distended, +bowel sounds.  Incision c/d/i with honeycomb RRR no MRGs CTAB Breasts: soft, no masses or discoloration. Right nipple erect and intact. Infant currently nursing with a good latch on left breast.  Ext: no c/c/e negative homan's   Recent Labs  Lab 02/26/23 1429 02/28/23 0526  WBC 9.8 13.1*  HGB 10.8* 10.4*  HCT 31.9* 31.0*  PLT 233 232    Disposition: Home  Rh Immune  globulin given: no Rubella vaccine given: no Tdap vaccine given in AP or PP setting: yes Flu vaccine given in AP or PP setting: yes  Contraception: no method  Prenatal/Postnatal Panel: B NEG//Rubella Immune//Varicella Not immune//RPR negative//HIV negative/HepB Surface Ag negative//pap no abnormalities (date: 07/2022)//plans to breastfeed  Plan:  AsASIE BONNETTEas discharged to home in good condition. Follow-up appointment with DJE on March 13 for a Postop  visit  Future Appointments  Date Time Provider [place]3/13/2024  3:15 PM EvHarlin HeysMD [facility] None    Discharge Medications: Allergies as of 03/01/2023   No Known Allergies      Medication List     STOP taking these medications    FOLATE PO       TAKE these medications    Choline Bitartrate ER 300 MG Tbcr Take 1 tablet by mouth daily.   D3-50 1.25 MG (50000 UT) capsule Generic drug: Cholecalciferol Take 50,000 Units by mouth once a week.   ibuprofen 600 MG tablet Commonly known as: ADVIL Take 1 tablet (600 mg total) by mouth every 6 (six) hours as needed.   multivitamin-prenatal 27-0.8 MG Tabs tablet Take 1 tablet by mouth daily at 12 noon.   omeprazole 20 MG capsule Commonly known as: PRILOSEC Take 20 mg by mouth daily.   OVER THE COUNTER MEDICATION Take 200 mg by mouth daily. CO-Q-10   oxyCODONE-acetaminophen 5-325 MG tablet Commonly  known as: PERCOCET/ROXICET Take 1-2 tablets by mouth every 4 (four) hours as needed for moderate pain.   Synthroid 75 MCG tablet Generic drug: levothyroxine Take 75 mcg by mouth every morning.               Discharge Care Instructions  (From admission, onward)           Start     Ordered   03/01/23 0000  Discharge wound care:       Comments: SHOWER DAILY Wash incision gently with soap and water.  Call office with any drainage, redness, or firmness of the incision.   03/01/23 [place], CNM   03/01/2023 10:30 AM

## 2023-03-02 ENCOUNTER — Encounter: Payer: Self-pay | Admitting: Obstetrics and Gynecology

## 2023-03-03 ENCOUNTER — Encounter: Payer: Self-pay | Admitting: Certified Nurse Midwife

## 2023-03-04 ENCOUNTER — Encounter: Payer: Managed Care, Other (non HMO) | Admitting: Obstetrics and Gynecology

## 2023-03-04 ENCOUNTER — Other Ambulatory Visit: Payer: Managed Care, Other (non HMO)

## 2023-03-11 ENCOUNTER — Ambulatory Visit (INDEPENDENT_AMBULATORY_CARE_PROVIDER_SITE_OTHER): Payer: Managed Care, Other (non HMO) | Admitting: Obstetrics and Gynecology

## 2023-03-11 ENCOUNTER — Encounter: Payer: Self-pay | Admitting: Obstetrics and Gynecology

## 2023-03-11 VITALS — BP 117/80 | HR 61 | Ht 65.0 in | Wt 169.5 lb

## 2023-03-11 DIAGNOSIS — O2686 Pruritic urticarial papules and plaques of pregnancy (PUPPP): Secondary | ICD-10-CM

## 2023-03-11 DIAGNOSIS — Z9889 Other specified postprocedural states: Secondary | ICD-10-CM

## 2023-03-11 MED ORDER — HYDROXYZINE HCL 25 MG PO TABS: 25.0000 mg | ORAL_TABLET | Freq: Four times a day (QID) | ORAL | 1 refills | Status: DC | PRN

## 2023-03-11 NOTE — Progress Notes (Signed)
Patient presents today for 1 week postpartum and incision follow-up. Patient had a cesarean  delivery on 03/06/23.  She is pumping and bottle feeding. She states she is unsure of what she would like for birth control. EPDS score of 0 . She states no other questions or concerns at this time.

## 2023-03-11 NOTE — Progress Notes (Signed)
HPI:      [name] is a 32 y.o. [phone] who LMP was No LMP recorded.  Subjective:   She presents today 1 week postop from cesarean delivery for breech.  She is doing well.  Reports no pain.  She is ambulating voiding and eating without difficulty having bowel movements without problem.  She is no longer breast-feeding. She does report that her PUPP is back and she has itching.Marland Kitchen    Hx: The following portions of the patient's history were reviewed and updated as appropriate:             She  has a past medical history of Anemia, Family history of adverse reaction to anesthesia, and GERD (gastroesophageal reflux disease). She does not have any pertinent problems on file. She  has a past surgical history that includes Wisdom tooth extraction; LASIK; and Cesarean section (N/A, 02/27/2023). Her family history includes Atrial fibrillation in her maternal grandmother; Cancer (age of onset: 57) in her mother; Diabetes in her paternal grandmother; Gout in her father; Hyperlipidemia in her paternal grandfather; Hypertension in her father; Thyroid disease in her mother. She  reports that she has never smoked. She has never used smokeless tobacco. She reports that she does not drink alcohol and does not use drugs. She has a current medication list which includes the following prescription(s): choline bitartrate er, d3-50, hydroxyzine, ibuprofen, omeprazole, OVER THE COUNTER MEDICATION, oxycodone-acetaminophen, multivitamin-prenatal, and synthroid. She has No Known Allergies.       Review of Systems:  Review of Systems  Constitutional: Denied constitutional symptoms, night sweats, recent illness, fatigue, fever, insomnia and weight loss.  Eyes: Denied eye symptoms, eye pain, photophobia, vision change and visual disturbance.  Ears/Nose/Throat/Neck: Denied ear, nose, throat or neck symptoms, hearing loss, nasal discharge, sinus congestion and sore throat.  Cardiovascular: Denied cardiovascular  symptoms, arrhythmia, chest pain/pressure, edema, exercise intolerance, orthopnea and palpitations.  Respiratory: Denied pulmonary symptoms, asthma, pleuritic pain, productive sputum, cough, dyspnea and wheezing.  Gastrointestinal: Denied, gastro-esophageal reflux, melena, nausea and vomiting.  Genitourinary: Denied genitourinary symptoms including symptomatic vaginal discharge, pelvic relaxation issues, and urinary complaints.  Musculoskeletal: Denied musculoskeletal symptoms, stiffness, swelling, muscle weakness and myalgia.  Dermatologic: See HPI for additional information.  Neurologic: Denied neurology symptoms, dizziness, headache, neck pain and syncope.  Psychiatric: Denied psychiatric symptoms, anxiety and depression.  Endocrine: Denied endocrine symptoms including hot flashes and night sweats.   Meds:   Current Outpatient Medications on File Prior to Visit  Medication Sig Dispense Refill   Choline Bitartrate ER 300 MG TBCR Take 1 tablet by mouth daily.     D3-50 1.25 MG (50000 UT) capsule Take 50,000 Units by mouth once a week.     ibuprofen (ADVIL) 600 MG tablet Take 1 tablet (600 mg total) by mouth every 6 (six) hours as needed. 30 tablet 0   omeprazole (PRILOSEC) 20 MG capsule Take 20 mg by mouth daily.     OVER THE COUNTER MEDICATION Take 200 mg by mouth daily. CO-Q-10     oxyCODONE-acetaminophen (PERCOCET/ROXICET) 5-325 MG tablet Take 1-2 tablets by mouth every 4 (four) hours as needed for moderate pain. 12 tablet 0   Prenatal Vit-Fe Fumarate-FA (MULTIVITAMIN-PRENATAL) 27-0.8 MG TABS tablet Take 1 tablet by mouth daily at 12 noon.     SYNTHROID 75 MCG tablet Take 75 mcg by mouth every morning.     No current facility-administered medications on file prior to visit.      Objective:  Vitals:   03/11/23 1525  BP: 117/80  Pulse: 61   Filed Weights   03/11/23 1525  Weight: 169 lb 8 oz (76.9 kg)               Abdomen: Soft.  Non-tender.  No masses.  No HSM.   Incision/s: Intact.  Healing well.  No erythema.  No drainage.             Assessment:    [id number] Patient Active Problem List   Diagnosis Date Noted   Post-operative state 02/27/2023   [redacted] weeks gestation of pregnancy 02/26/2023   [redacted] weeks gestation of pregnancy 02/19/2023   Supervision of normal pregnancy 02/19/2023   AFI (amniotic fluid index) borderline low 02/05/2023   Breech presentation, no version 02/05/2023   Circumvallate placenta in third trimester 02/05/2023   Supervision of other normal pregnancy, antepartum 08/07/2022     1. Postoperative state   2. Postpartum care following cesarean delivery   3. PUPP (pruritic urticarial papules and plaques of pregnancy)     Excellent recovery postpartum and postop   Plan:            1.  Wound care discussed  2.  Atarax for PUPP Orders No orders of the defined types were placed in this encounter.    Meds ordered this encounter  Medications   hydrOXYzine (ATARAX) 25 MG tablet    Sig: Take 1 tablet (25 mg total) by mouth every 6 (six) hours as needed for itching.    Dispense:  30 tablet    Refill:  1      F/U  Return in about 5 weeks (around 04/15/2023).  Finis Bud, M.D. 03/11/2023 3:38 PM

## 2023-03-28 ENCOUNTER — Encounter: Payer: Self-pay | Admitting: Obstetrics and Gynecology

## 2023-04-06 ENCOUNTER — Telehealth: Payer: Self-pay

## 2023-04-06 NOTE — Telephone Encounter (Signed)
[facility]- Discharge Call Backs-Spoke to pt about the following below. 1-Do you have any questions or concerns about yourself as you heal?No 2-Any concerns or questions about your baby?No 3- Reviewed ABC's of safe sleep. 4-How was your stay at the hospital?Good 5-How did our team work together to care for you?Fine You should be receiving a survey in the mail soon.   We would really appreciate it if you could fill that out for [place] and return it in the mail.  We value the feedback to make improvements and continue the great work we do.   If you have any questions please feel free to call me back at [phone]

## 2023-04-15 ENCOUNTER — Ambulatory Visit (INDEPENDENT_AMBULATORY_CARE_PROVIDER_SITE_OTHER): Payer: Managed Care, Other (non HMO) | Admitting: Obstetrics and Gynecology

## 2023-04-15 ENCOUNTER — Encounter: Payer: Self-pay | Admitting: Obstetrics and Gynecology

## 2023-04-15 VITALS — BP 106/75 | HR 60 | Ht 65.0 in | Wt 159.7 lb

## 2023-04-15 DIAGNOSIS — Z9889 Other specified postprocedural states: Secondary | ICD-10-CM

## 2023-04-15 NOTE — Progress Notes (Signed)
HPI:      [name] is a 32 y.o. [phone] who LMP was No LMP recorded.  Subjective:   She presents today 6 weeks from cesarean delivery for breech.  She reports she is doing well.  She is bottlefeeding and pumping and breast-feeding.  She plans to use condoms for birth control.  She reports absolutely no issues at this time.    Hx: The following portions of the patient's history were reviewed and updated as appropriate:             She  has a past medical history of Anemia, Family history of adverse reaction to anesthesia, and GERD (gastroesophageal reflux disease). She does not have any pertinent problems on file. She  has a past surgical history that includes Wisdom tooth extraction; LASIK; and Cesarean section (N/A, 02/27/2023). Her family history includes Atrial fibrillation in her maternal grandmother; Cancer (age of onset: 45) in her mother; Diabetes in her paternal grandmother; Gout in her father; Hyperlipidemia in her paternal grandfather; Hypertension in her father; Thyroid disease in her mother. She  reports that she has never smoked. She has never used smokeless tobacco. She reports that she does not drink alcohol and does not use drugs. She has a current medication list which includes the following prescription(s): choline bitartrate er, d3-50, hydroxyzine, ibuprofen, omeprazole, OVER THE COUNTER MEDICATION, multivitamin-prenatal, and synthroid. She has No Known Allergies.       Review of Systems:  Review of Systems  Constitutional: Denied constitutional symptoms, night sweats, recent illness, fatigue, fever, insomnia and weight loss.  Eyes: Denied eye symptoms, eye pain, photophobia, vision change and visual disturbance.  Ears/Nose/Throat/Neck: Denied ear, nose, throat or neck symptoms, hearing loss, nasal discharge, sinus congestion and sore throat.  Cardiovascular: Denied cardiovascular symptoms, arrhythmia, chest pain/pressure, edema, exercise intolerance, orthopnea  and palpitations.  Respiratory: Denied pulmonary symptoms, asthma, pleuritic pain, productive sputum, cough, dyspnea and wheezing.  Gastrointestinal: Denied, gastro-esophageal reflux, melena, nausea and vomiting.  Genitourinary: Denied genitourinary symptoms including symptomatic vaginal discharge, pelvic relaxation issues, and urinary complaints.  Musculoskeletal: Denied musculoskeletal symptoms, stiffness, swelling, muscle weakness and myalgia.  Dermatologic: Denied dermatology symptoms, rash and scar.  Neurologic: Denied neurology symptoms, dizziness, headache, neck pain and syncope.  Psychiatric: Denied psychiatric symptoms, anxiety and depression.  Endocrine: Denied endocrine symptoms including hot flashes and night sweats.   Meds:   Current Outpatient Medications on File Prior to Visit  Medication Sig Dispense Refill   Choline Bitartrate ER 300 MG TBCR Take 1 tablet by mouth daily.     D3-50 1.25 MG (50000 UT) capsule Take 50,000 Units by mouth once a week.     hydrOXYzine (ATARAX) 25 MG tablet Take 1 tablet (25 mg total) by mouth every 6 (six) hours as needed for itching. 30 tablet 1   ibuprofen (ADVIL) 600 MG tablet Take 1 tablet (600 mg total) by mouth every 6 (six) hours as needed. 30 tablet 0   omeprazole (PRILOSEC) 20 MG capsule Take 20 mg by mouth daily.     OVER THE COUNTER MEDICATION Take 200 mg by mouth daily. CO-Q-10     Prenatal Vit-Fe Fumarate-FA (MULTIVITAMIN-PRENATAL) 27-0.8 MG TABS tablet Take 1 tablet by mouth daily at 12 noon.     SYNTHROID 75 MCG tablet Take 75 mcg by mouth every morning.     No current facility-administered medications on file prior to visit.      Objective:     Vitals:   04/15/23 1335  BP:  106/75  Pulse: 60   Filed Weights   04/15/23 1335  Weight: 159 lb 11.2 oz (72.4 kg)               Abdomen: Soft.  Non-tender.  No masses.  No HSM.  Incision/s: Intact.  Healing well.  No erythema.  No drainage.             Assessment:     [id number] Patient Active Problem List   Diagnosis Date Noted   Post-operative state 02/27/2023   [redacted] weeks gestation of pregnancy 02/26/2023   [redacted] weeks gestation of pregnancy 02/19/2023   Supervision of normal pregnancy 02/19/2023   AFI (amniotic fluid index) borderline low 02/05/2023   Breech presentation, no version 02/05/2023   Circumvallate placenta in third trimester 02/05/2023   Supervision of other normal pregnancy, antepartum 08/07/2022     1. Postoperative state   2. Postpartum care following cesarean delivery     Excellent recovery status post cesarean delivery for breech   Plan:            1.  Patient may resume normal activities with exception of heavy lifting. Orders No orders of the defined types were placed in this encounter.   No orders of the defined types were placed in this encounter.     F/U  Return in about 3 months (around 07/15/2023) for Annual Physical.  Elonda Husky, M.D. 04/15/2023 2:06 PM

## 2023-04-15 NOTE — Progress Notes (Signed)
Patient presents today for 6 week postpartum follow-up. Patient had a cesarean delivery on 02/27/23 due to breech postioning.  She is breast and bottle feeding along with pumping. She states she would like to use condoms for birth control at this time. EPDS score of 0. She states no other questions or concerns at this time.

## 2023-05-15 IMAGING — US US OB < 14 WEEKS - US OB TV
1 series · 13 of 28 positions shown · non-contrast
Comparison: None.

CLINICAL DATA: First trimester pregnancy. Vaginal bleeding since
yesterday. LMP E 01/09/2022. Beta HCG level 30.

EXAM:
OBSTETRIC <14 WK US AND TRANSVAGINAL OB US
TECHNIQUE: Both transabdominal and transvaginal ultrasound examinations were
performed for complete evaluation of the gestation as well as the
maternal uterus, adnexal regions, and pelvic cul-de-sac.
Transvaginal technique was performed to assess early pregnancy.

[Series 1: us ob less than 14 weeks with ob transvaginal · 78 acquisitions, 13 frames shown]
[im 3/78]
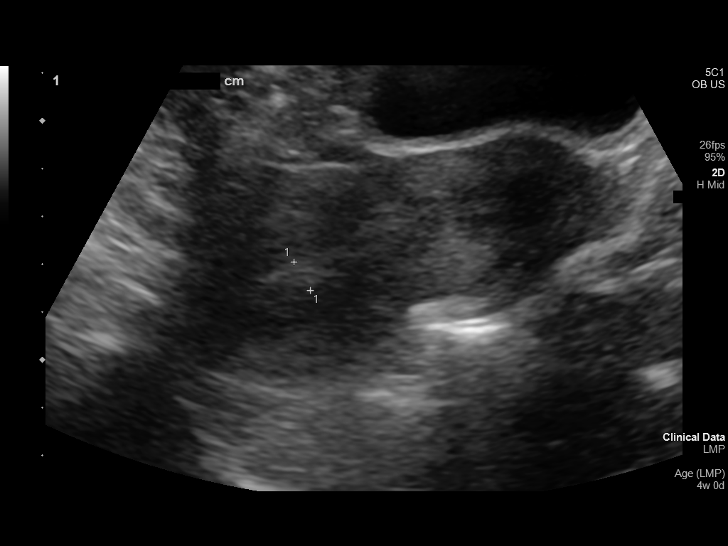
[im 9/78]
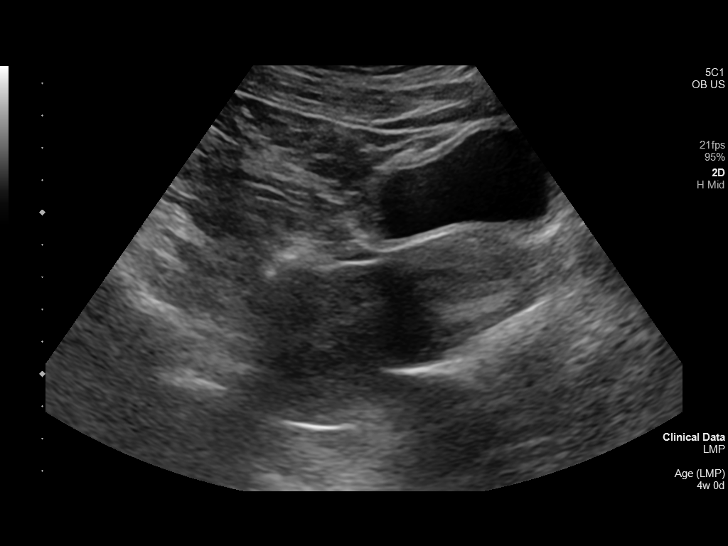
[im 15/78]
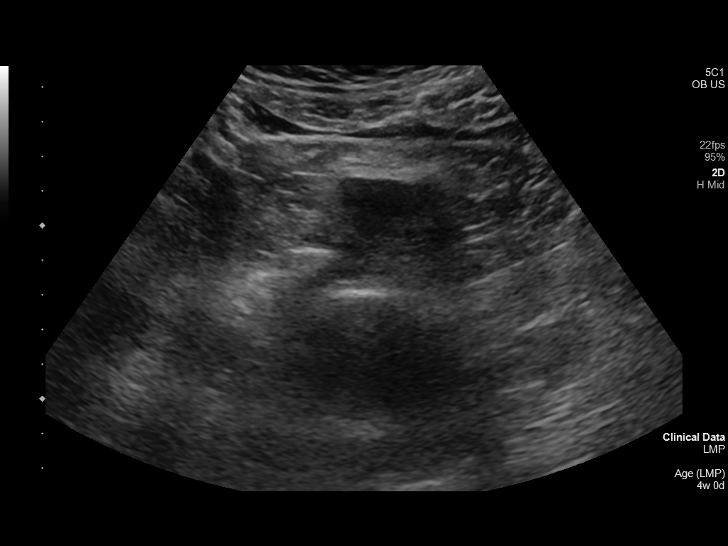
[im 20/78]
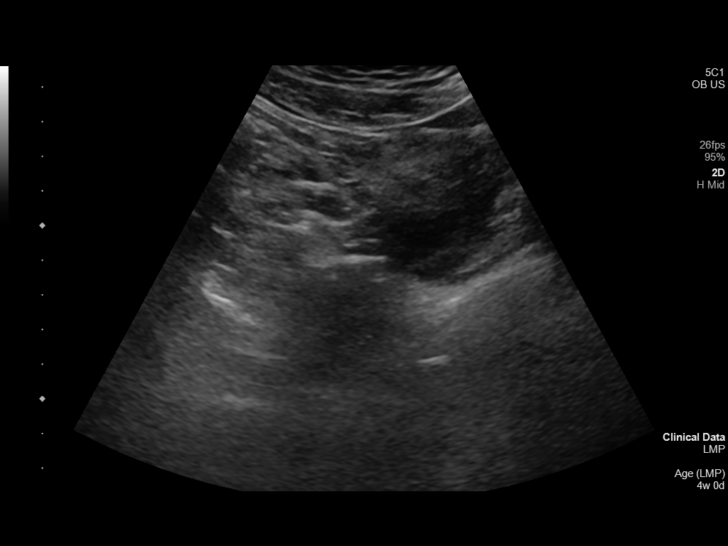
[im 26/78]
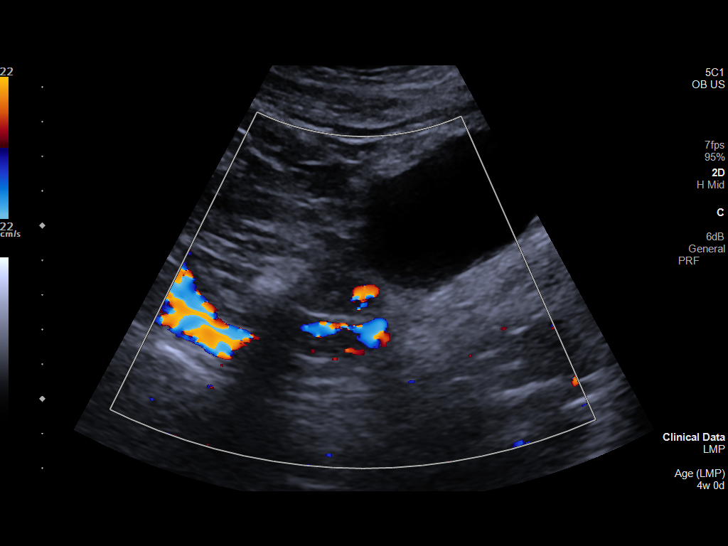
[im 32/78]
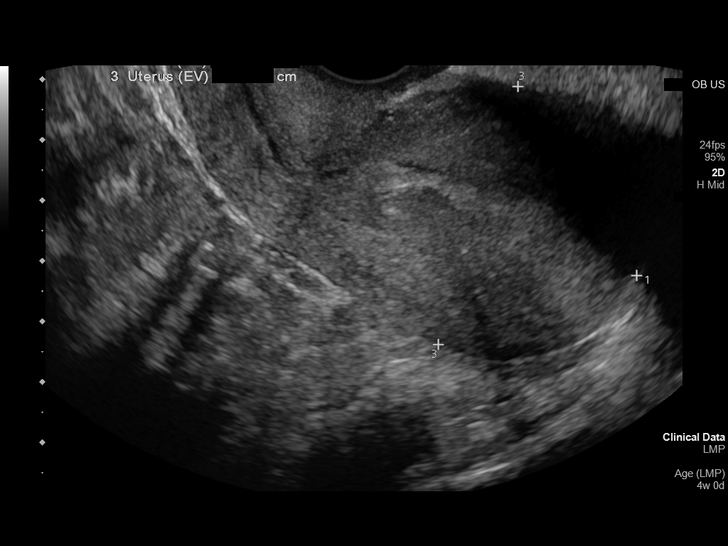
[im 40/78]
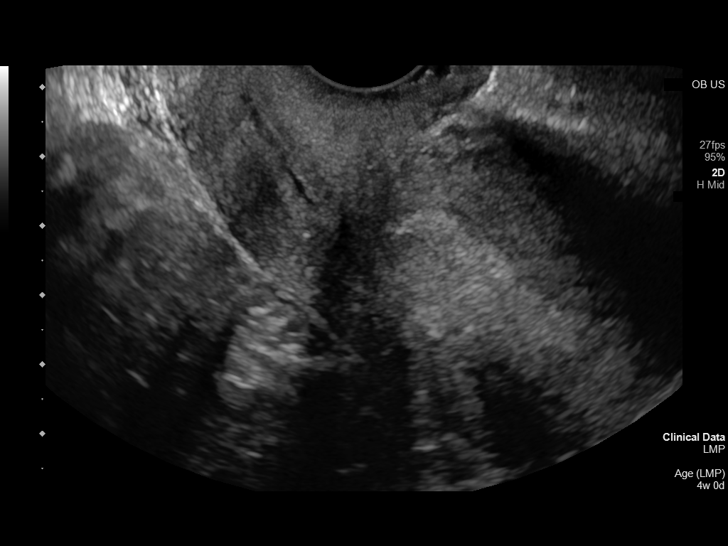
[im 46/78]
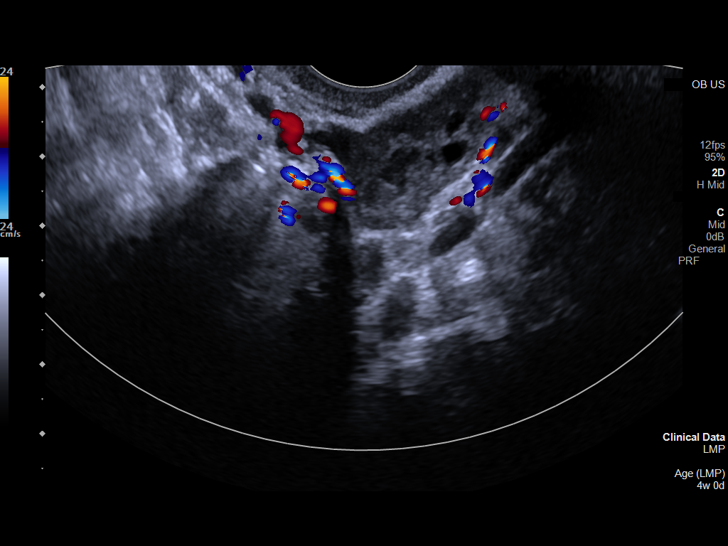
[im 52/78]
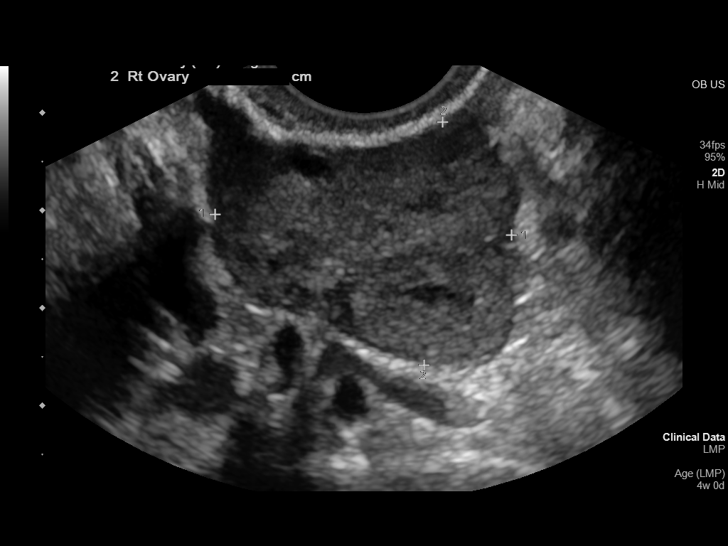
[im 58/78]
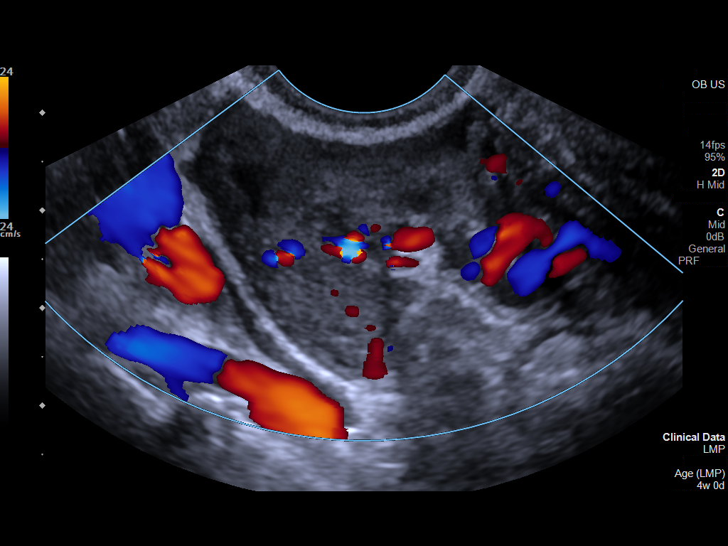
[im 63/78]
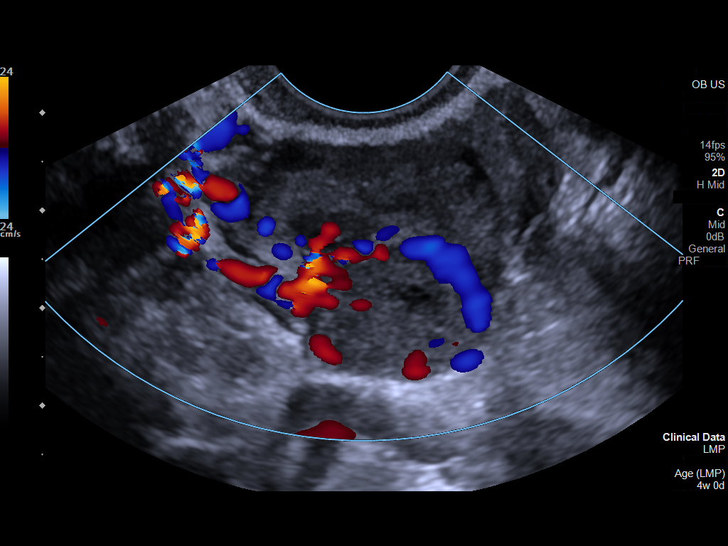
[im 69/78]
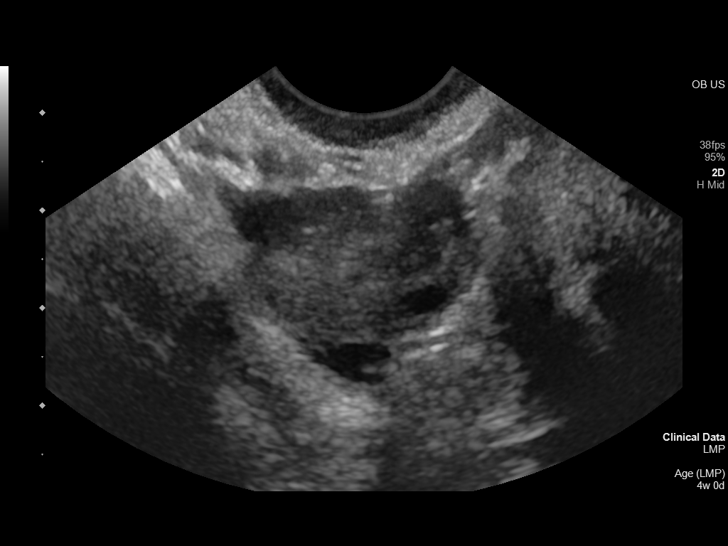
[im 75/78]
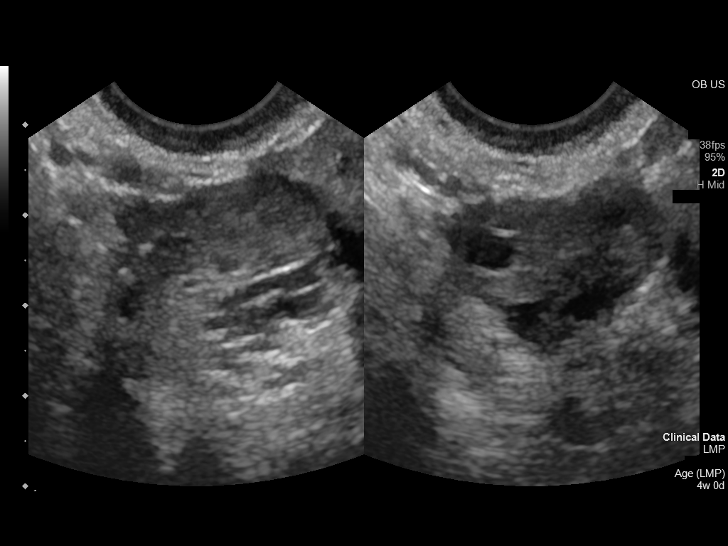

[13 of 28 positions shown; findings below may reference images not displayed]

FINDINGS: Intrauterine gestational sac: None visualized.

Yolk sac:  None visualized.

Embryo:  None visualized.

Subchorionic hemorrhage:  None.

Maternal uterus/adnexae: There is endometrial thickening with mobile
debris in the endometrial cavity, likely blood. No suspicious
adnexal findings. Probable corpus luteum on the right. No
significant free pelvic fluid.
IMPRESSION: No intrauterine gestational sac, yolk sac, fetal pole, or cardiac
activity visualized. Differential considerations include
intrauterine gestation too early to be sonographically visualized,
spontaneous abortion, or ectopic pregnancy. Mobile debris in the
endometrial cavity suggests blood, making spontaneous abortion most
likely. Consider follow-up ultrasound in 14 days and serial
quantitative beta HCG follow-up.

## 2023-07-31 IMAGING — US US THYROID
1 series · 14 of 25 positions shown · non-contrast
Comparison: None.

CLINICAL DATA: Goiter.

EXAM:
THYROID ULTRASOUND
TECHNIQUE: Ultrasound examination of the thyroid gland and adjacent soft
tissues was performed.

[Series 1: us thyroid · 0.07mm/px · 42 acquisitions, 14 frames shown]
[im 1/42]
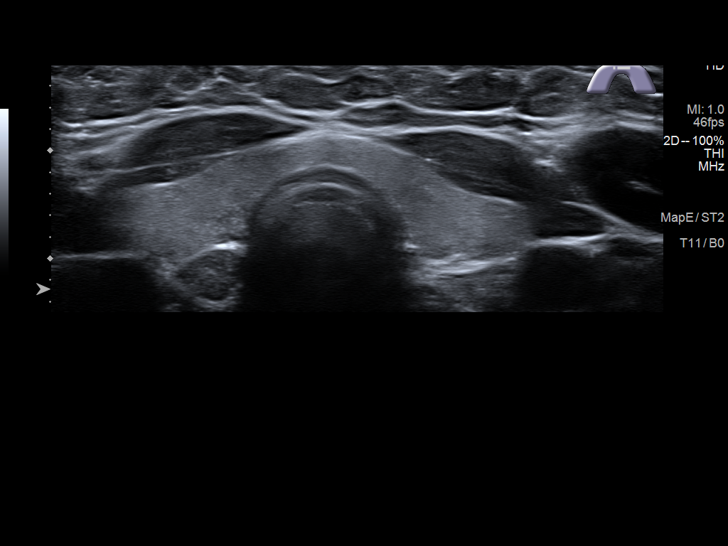
[im 4/42]
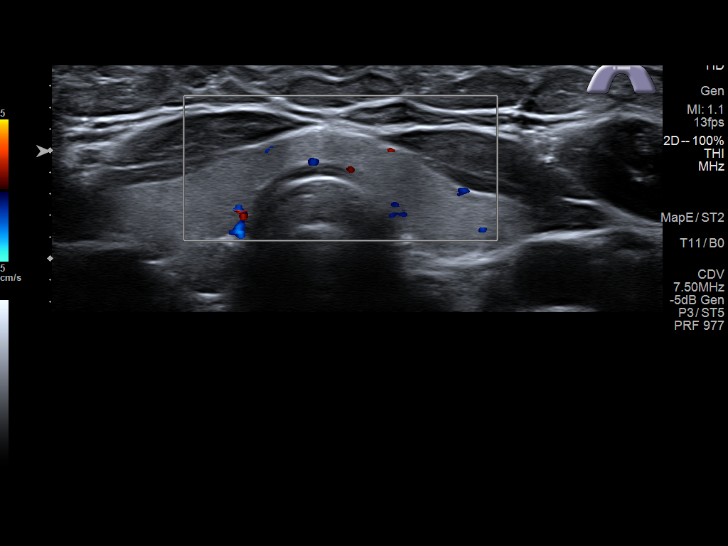
[im 7/42]
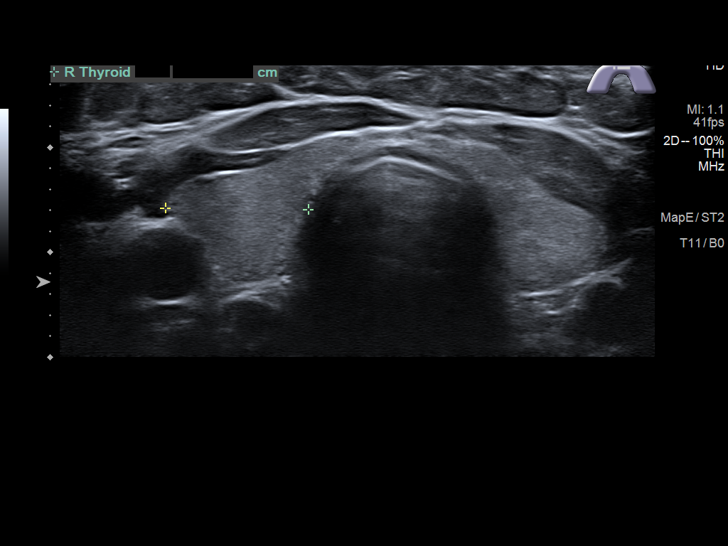
[im 11/42]
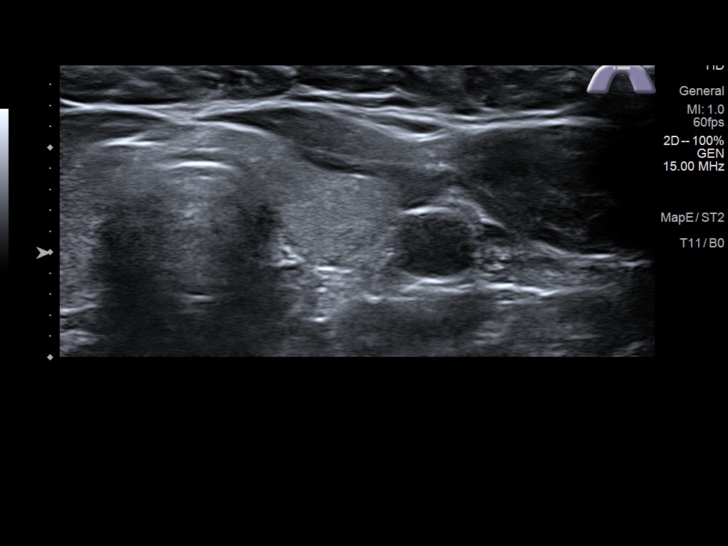
[im 14/42]
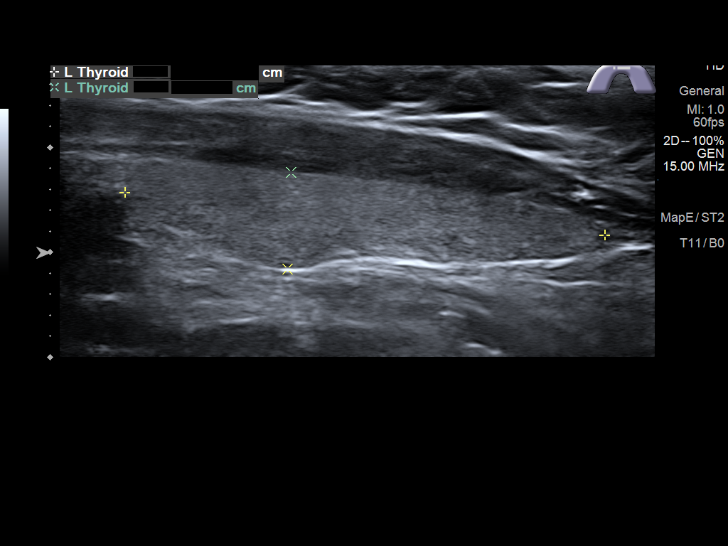
[im 16/42]
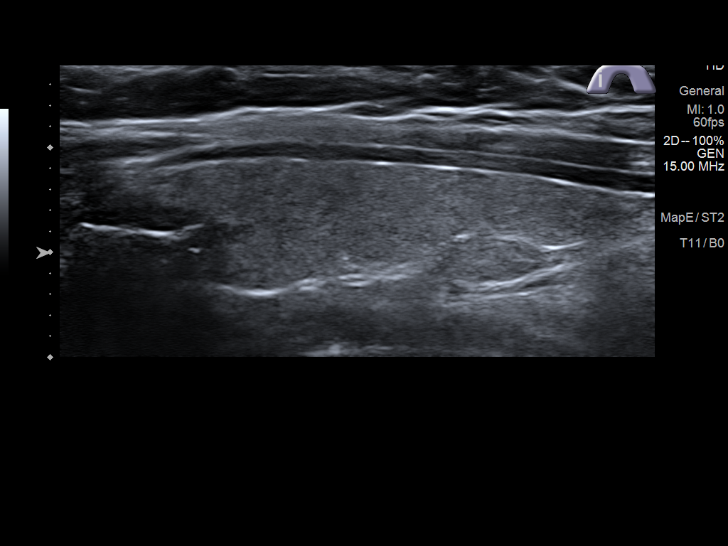
[im 19/42]
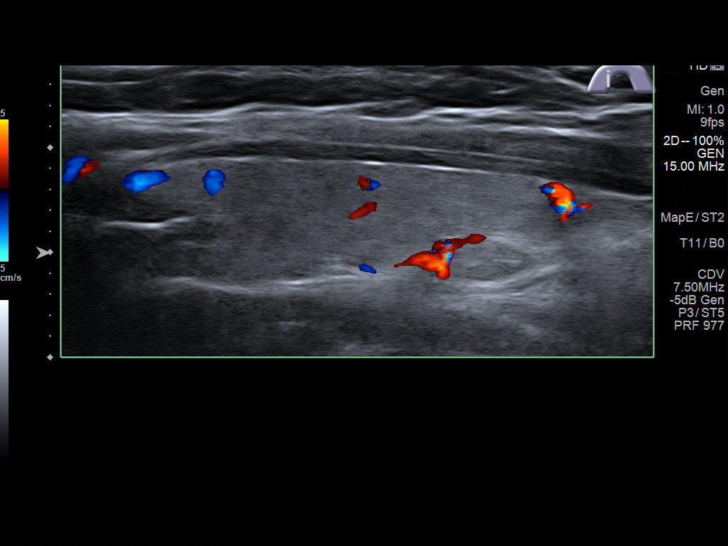
[im 23/42]
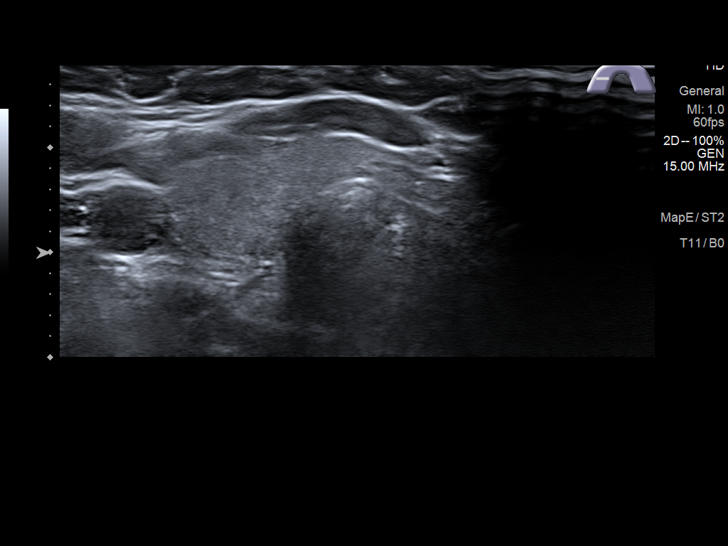
[im 26/42]
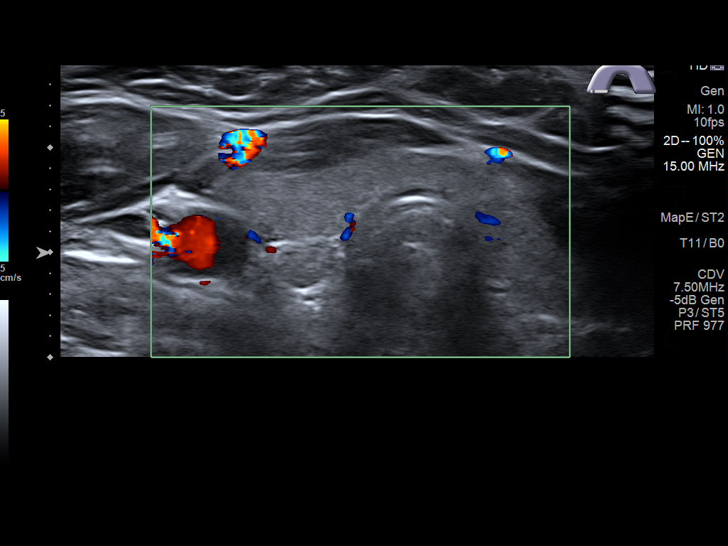
[im 28/42]
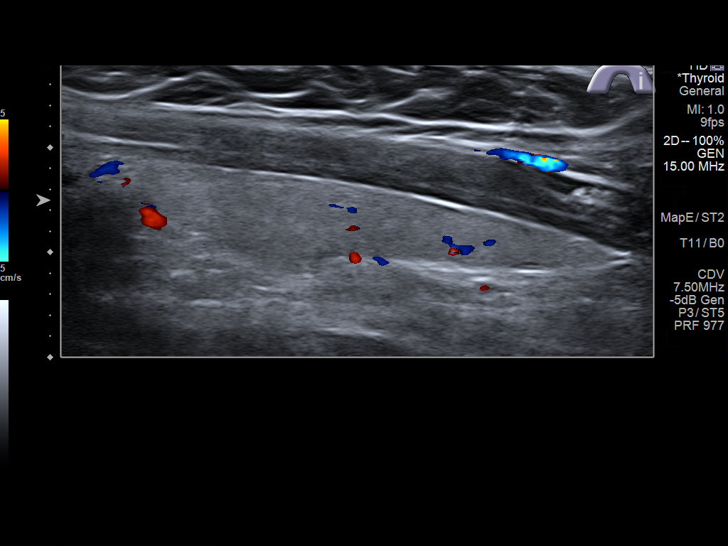
[im 31/42]
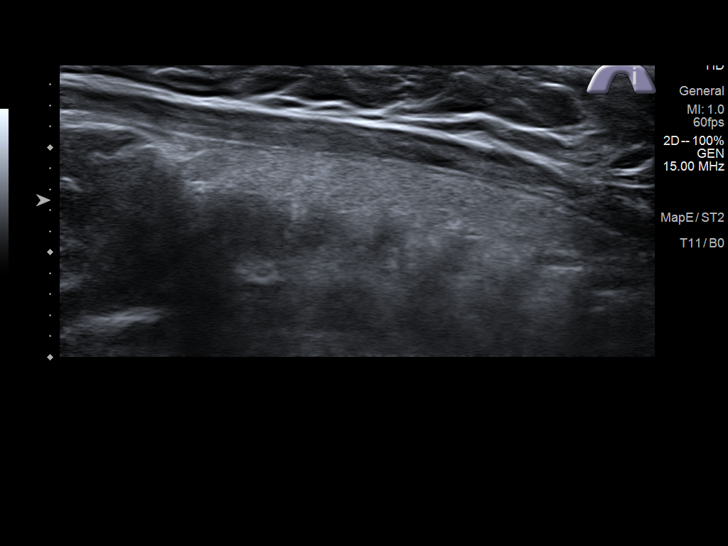
[im 35/42]
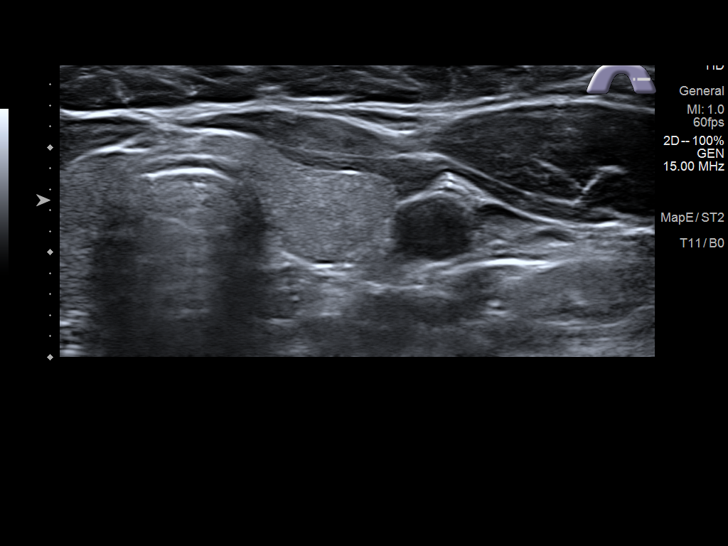
[im 38/42]
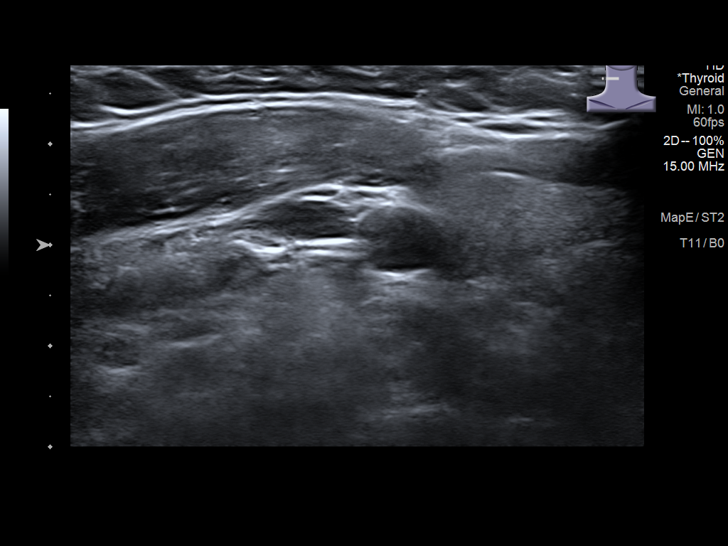
[im 42/42]
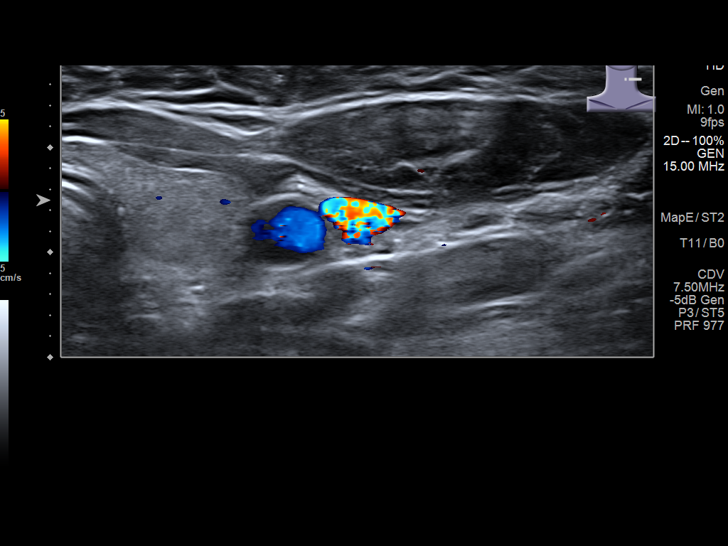

[14 of 25 positions shown; findings below may reference images not displayed]

FINDINGS: Parenchymal Echotexture: Normal

Isthmus: 0.3 cm

Right lobe: 4.8 x 1.2 x 1.4 cm

Left lobe: 4.6 x 0.9 x 1.2 cm

_________________________________________________________

Estimated total number of nodules >/= 1 cm: 0

Number of spongiform nodules >/=  2 cm not described below (TR1): 0

Number of mixed cystic and solid nodules >/= 1.5 cm not described
below (TR2): 0

_________________________________________________________

No discrete nodules are seen within the thyroid gland.
IMPRESSION: Normal size and appearance of the thyroid gland without discrete
nodule.

## 2024-01-13 ENCOUNTER — Ambulatory Visit: Payer: Managed Care, Other (non HMO) | Admitting: Dermatology

## 2024-07-26 ENCOUNTER — Other Ambulatory Visit: Payer: Self-pay | Admitting: Medical Genetics

## 2024-10-03 ENCOUNTER — Ambulatory Visit: Payer: Self-pay | Admitting: Cardiology

## 2024-10-03 ENCOUNTER — Encounter: Payer: Self-pay | Admitting: Cardiology

## 2024-10-03 VITALS — BP 108/70 | HR 70 | Ht 65.0 in | Wt 137.0 lb

## 2024-10-03 DIAGNOSIS — E039 Hypothyroidism, unspecified: Secondary | ICD-10-CM | POA: Insufficient documentation

## 2024-10-03 DIAGNOSIS — Z131 Encounter for screening for diabetes mellitus: Secondary | ICD-10-CM | POA: Diagnosis not present

## 2024-10-03 DIAGNOSIS — Z13220 Encounter for screening for lipoid disorders: Secondary | ICD-10-CM

## 2024-10-03 DIAGNOSIS — Z7689 Persons encountering health services in other specified circumstances: Secondary | ICD-10-CM | POA: Diagnosis not present

## 2024-10-03 DIAGNOSIS — Z0130 Encounter for examination of blood pressure without abnormal findings: Secondary | ICD-10-CM

## 2024-10-03 MED ORDER — SYNTHROID 75 MCG PO TABS: 75.0000 ug | ORAL_TABLET | Freq: Every morning | ORAL | 1 refills | Status: AC

## 2024-10-03 NOTE — Progress Notes (Signed)
 New Patient Office Visit  Subjective   Patient ID: Linda Madden, female    DOB: 09-13-1991  Age: 33 y.o. MRN: [id number]  CC:  Chief Complaint  Patient presents with   New Patient (Initial Visit)    New Patient     HPI Linda Madden presents to establish care Previous Primary Care provider/office:   she does not have additional concerns to discuss today.   Patient in office to establish care. Patient doing well, no complaints today. Due for blood work prior to next visit.  Synthroid  refilled.  Up to date on pap smear.  Continue same medication.    Outpatient Encounter Medications as of 10/03/2024  Medication Sig   ibuprofen  (ADVIL ) 600 MG tablet Take 1 tablet (600 mg total) by mouth every 6 (six) hours as needed.   OVER THE COUNTER MEDICATION Take 200 mg by mouth daily. CO-Q-10   Prenatal Vit-Fe Fumarate-FA (MULTIVITAMIN-PRENATAL) 27-0.8 MG TABS tablet Take 1 tablet by mouth daily at 12 noon.   [DISCONTINUED] SYNTHROID  75 MCG tablet Take 75 mcg by mouth every morning.   SYNTHROID  75 MCG tablet Take 1 tablet (75 mcg total) by mouth every morning.   [DISCONTINUED] Choline Bitartrate ER 300 MG TBCR Take 1 tablet by mouth daily.   [DISCONTINUED] D3-50 1.25 MG (50000 UT) capsule Take 50,000 Units by mouth once a week.   [DISCONTINUED] hydrOXYzine  (ATARAX ) 25 MG tablet Take 1 tablet (25 mg total) by mouth every 6 (six) hours as needed for itching.   [DISCONTINUED] omeprazole (PRILOSEC) 20 MG capsule Take 20 mg by mouth daily.   No facility-administered encounter medications on file as of 10/03/2024.    Past Medical History:  Diagnosis Date   [redacted] weeks gestation of pregnancy 02/19/2023   [redacted] weeks gestation of pregnancy 02/26/2023   AFI (amniotic fluid index) borderline low 02/05/2023   Anemia    Breech presentation, no version 02/05/2023   Circumvallate placenta in third trimester 02/05/2023   Family history of adverse reaction to anesthesia    nausea and  vomiting   GERD (gastroesophageal reflux disease)    Post-operative state 02/27/2023   Supervision of normal pregnancy 02/19/2023   Supervision of other normal pregnancy, antepartum 08/07/2022              Clinical Staff    Provider      Office Location     [facility]    Dating     03/06/2023, by Last Menstrual Period      Language     English    Anatomy US      Normal except circumvallate placenta      Flu Vaccine          Genetic Screen     NIPS: MaterniT21 neg, female      TDaP vaccine      01/06/23       Hgb A1C or   GTT    Early :  Third trimester : 131      Covid              LAB RE    Past Surgical History:  Procedure Laterality Date   CESAREAN SECTION N/A 02/27/2023   Procedure: CESAREAN SECTION;  Surgeon: Janit Alm Agent, MD;  Location: [facility];  Service: Obstetrics;  Laterality: N/A;   LASIK     WISDOM TOOTH EXTRACTION      Family History  Problem Relation Age of Onset   Cancer Mother 58  cervical   Thyroid  disease Mother    Hypertension Father    Gout Father    Atrial fibrillation Maternal Grandmother    Diabetes Paternal Grandmother        type 2   Hyperlipidemia Paternal Grandfather     Social History   Socioeconomic History   Marital status: Married    Spouse name: Charles   Number of children: 0   Years of education: 18   Highest education level: Not on file  Occupational History   Occupation: Research scientist (physical sciences)  Tobacco Use   Smoking status: Never   Smokeless tobacco: Never  Vaping Use   Vaping status: Never Used  Substance and Sexual Activity   Alcohol use: Never   Drug use: Never   Sexual activity: Not Currently    Partners: Male    Birth control/protection: None  Other Topics Concern   Not on file  Social History Narrative   Not on file   Social Drivers of Health   Financial Resource Strain: Low Risk  (08/07/2022)   Overall Financial Resource Strain (CARDIA)    Difficulty of Paying Living Expenses: Not hard at all  Food  Insecurity: No Food Insecurity (02/27/2023)   Hunger Vital Sign    Worried About Running Out of Food in the Last Year: Never true    Ran Out of Food in the Last Year: Never true  Transportation Needs: No Transportation Needs (02/27/2023)   PRAPARE - Administrator, Civil Service (Medical): No    Lack of Transportation (Non-Medical): No  Physical Activity: Inactive (08/07/2022)   Exercise Vital Sign    Days of Exercise per Week: 0 days    Minutes of Exercise per Session: 0 min  Stress: No Stress Concern Present (08/07/2022)   [employer] of Occupational Health - Occupational Stress Questionnaire    Feeling of Stress : Not at all  Social Connections: Moderately Isolated (08/07/2022)   Social Connection and Isolation Panel    Frequency of Communication with Friends and Family: Twice a week    Frequency of Social Gatherings with Friends and Family: More than three times a week    Attends Religious Services: Never    Database administrator or Organizations: No    Attends Banker Meetings: Never    Marital Status: Married  Catering manager Violence: Not At Risk (02/27/2023)   Humiliation, Afraid, Rape, and Kick questionnaire    Fear of Current or Ex-Partner: No    Emotionally Abused: No    Physically Abused: No    Sexually Abused: No    Review of Systems  Constitutional: Negative.   HENT: Negative.    Eyes: Negative.   Respiratory: Negative.  Negative for shortness of breath.   Cardiovascular: Negative.  Negative for chest pain.  Gastrointestinal: Negative.  Negative for abdominal pain, constipation and diarrhea.  Genitourinary: Negative.   Musculoskeletal:  Negative for joint pain and myalgias.  Skin: Negative.   Neurological: Negative.  Negative for dizziness and headaches.  Endo/Heme/Allergies: Negative.   All other systems reviewed and are negative.       Objective   BP 108/70   Pulse 70   Ht 5' 5 (1.651 m)   Wt 137 lb (62.1 kg)   SpO2 98%    BMI 22.80 kg/m   Physical Exam Vitals and nursing note reviewed.  Constitutional:      Appearance: Normal appearance. She is normal weight.  HENT:     Head: Normocephalic  and atraumatic.     Nose: Nose normal.     Mouth/Throat:     Mouth: Mucous membranes are moist.  Eyes:     Extraocular Movements: Extraocular movements intact.     Conjunctiva/sclera: Conjunctivae normal.     Pupils: Pupils are equal, round, and reactive to light.  Cardiovascular:     Rate and Rhythm: Normal rate and regular rhythm.     Pulses: Normal pulses.     Heart sounds: Normal heart sounds.  Pulmonary:     Effort: Pulmonary effort is normal.     Breath sounds: Normal breath sounds.  Abdominal:     General: Abdomen is flat. Bowel sounds are normal.     Palpations: Abdomen is soft.  Musculoskeletal:        General: Normal range of motion.     Cervical back: Normal range of motion.  Skin:    General: Skin is warm and dry.  Neurological:     General: No focal deficit present.     Mental Status: She is alert and oriented to person, place, and time.  Psychiatric:        Mood and Affect: Mood normal.        Behavior: Behavior normal.        Thought Content: Thought content normal.        Judgment: Judgment normal.        Assessment & Plan:  Continue synthroid  75 mg  Problem List Items Addressed This Visit       Endocrine   Hypothyroidism - Primary   Relevant Medications   SYNTHROID  75 MCG tablet   Other Relevant Orders   TSH   CMP14+EGFR   T4, Free   T3, free     Other   Encounter to establish care   Other Visit Diagnoses       Diabetes mellitus screening       Relevant Orders   CMP14+EGFR   Hemoglobin A1c     Lipid screening       Relevant Orders   CMP14+EGFR   Lipid Profile       Return in about 3 months (around 01/03/2025) for fasting lab work prior.   Total time spent: 25 minutes  [employer], NP  10/03/2024   This document may have been prepared by Dragon  Voice Recognition software and as such may include unintentional dictation errors.

## 2024-10-12 ENCOUNTER — Other Ambulatory Visit: Payer: Self-pay | Admitting: Genetic Counselor

## 2024-10-12 DIAGNOSIS — Z006 Encounter for examination for normal comparison and control in clinical research program: Secondary | ICD-10-CM

## 2024-11-05 LAB — GENECONNECT MOLECULAR SCREEN: Genetic Analysis Overall Interpretation: NEGATIVE

## 2025-01-03 ENCOUNTER — Ambulatory Visit: Admitting: Cardiology

## 2025-01-08 ENCOUNTER — Ambulatory Visit
Admission: EM | Admit: 2025-01-08 | Discharge: 2025-01-08 | Disposition: A | Discharge status: Home / Self Care | Attending: Nurse Practitioner | Admitting: Nurse Practitioner

## 2025-01-08 ENCOUNTER — Encounter: Payer: Self-pay | Admitting: Emergency Medicine

## 2025-01-08 DIAGNOSIS — R21 Rash and other nonspecific skin eruption: Secondary | ICD-10-CM | POA: Diagnosis not present

## 2025-01-08 MED ORDER — METHYLPREDNISOLONE ACETATE 40 MG/ML IJ SUSP
40.0000 mg | Freq: Once | INTRAMUSCULAR | Status: AC
  Administered 2025-01-08: 40 mg via INTRAMUSCULAR

## 2025-01-08 MED ORDER — METHYLPREDNISOLONE SODIUM SUCC 40 MG IJ SOLR: 80.0000 mg | Freq: Once | INTRAMUSCULAR | Status: DC

## 2025-01-08 MED ORDER — PREDNISONE 10 MG (21) PO TBPK: ORAL_TABLET | ORAL | 0 refills | Status: AC

## 2025-01-08 MED ORDER — TRIAMCINOLONE ACETONIDE 0.1 % EX OINT: 1.0000 | TOPICAL_OINTMENT | Freq: Two times a day (BID) | CUTANEOUS | 0 refills | Status: AC

## 2025-01-08 NOTE — ED Provider Notes (Signed)
 " [facility]    CSN: [id number] Arrival date & time: 01/08/25  1051      History   Chief Complaint Chief Complaint  Patient presents with   Rash    HPI Linda Madden is a 34 y.o. female.   Patient presents today for itchy rash to chest ongoing for the past couple of weeks.  She denies recent change in any detergents, soaps, personal care products.  No new clothing, bed sheets, pajamas.  She has dogs but does not think they have been exposed to poison ivy.  Has tried topical over-the-counter hydrocortisone cream that helps for 5 to 10 minutes and then the itching returns.  Reports it is spreading.    Past Medical History:  Diagnosis Date   [redacted] weeks gestation of pregnancy 02/19/2023   [redacted] weeks gestation of pregnancy 02/26/2023   AFI (amniotic fluid index) borderline low 02/05/2023   Anemia    Breech presentation, no version 02/05/2023   Circumvallate placenta in third trimester 02/05/2023   Family history of adverse reaction to anesthesia    nausea and vomiting   GERD (gastroesophageal reflux disease)    Post-operative state 02/27/2023   Supervision of normal pregnancy 02/19/2023   Supervision of other normal pregnancy, antepartum 08/07/2022              Clinical Staff    Provider      Office Location     [facility]    Dating     03/06/2023, by Last Menstrual Period      Language     English    Anatomy US      Normal except circumvallate placenta      Flu Vaccine          Genetic Screen     NIPS: MaterniT21 neg, female      TDaP vaccine      01/06/23       Hgb A1C or   GTT    Early :  Third trimester : 131      Covid              LAB RE    Patient Active Problem List   Diagnosis Date Noted   Hypothyroidism 10/03/2024   Encounter to establish care 10/03/2024    Past Surgical History:  Procedure Laterality Date   CESAREAN SECTION N/A 02/27/2023   Procedure: CESAREAN SECTION;  Surgeon: Janit Alm Agent, MD;  Location: [facility];  Service: Obstetrics;   Laterality: N/A;   LASIK     WISDOM TOOTH EXTRACTION      OB History     Gravida  3   Para  1   Term  1   Preterm  0   AB  2   Living  1      SAB  1   IAB  0   Ectopic  1   Multiple  0   Live Births  1            Home Medications    Prior to Admission medications  Medication Sig Start Date End Date Taking? Authorizing Provider  predniSONE  (STERAPRED UNI-PAK 21 TAB) 10 MG (21) TBPK tablet Take 6 tablets (60 mg total) by mouth daily for 1 day, THEN 5 tablets (50 mg total) daily for 1 day, THEN 4 tablets (40 mg total) daily for 1 day, THEN 3 tablets (30 mg total) daily for 1 day, THEN 2 tablets (20 mg total) daily for 1  day, THEN 1 tablet (10 mg total) daily for 1 day. 01/08/25 01/14/25 Yes Chandra Harlene LABOR, NP  SYNTHROID  75 MCG tablet Take 1 tablet (75 mcg total) by mouth every morning. 10/03/24  Yes Scoggins, Amber, NP  triamcinolone  ointment (KENALOG ) 0.1 % Apply 1 Application topically 2 (two) times daily. Apply up to twice daily as needed for itchy rash; do not use for more than 14 days in a row. 01/08/25  Yes Chandra Harlene LABOR, NP  ibuprofen  (ADVIL ) 600 MG tablet Take 1 tablet (600 mg total) by mouth every 6 (six) hours as needed. 03/01/23   Dominic, Jinnie Jansky, CNM  OVER THE COUNTER MEDICATION Take 200 mg by mouth daily. CO-Q-10    [provider]  Prenatal Vit-Fe Fumarate-FA (MULTIVITAMIN-PRENATAL) 27-0.8 MG TABS tablet Take 1 tablet by mouth daily at 12 noon.    [provider]    Family History Family History  Problem Relation Age of Onset   Cancer Mother 73       cervical   Thyroid  disease Mother    Hypertension Father    Gout Father    Atrial fibrillation Maternal Grandmother    Diabetes Paternal Grandmother        type 2   Hyperlipidemia Paternal Grandfather     Social History Social History[1]   Allergies   Patient has no known allergies.   Review of Systems Review of Systems Per HPI  Physical Exam Triage Vital  Signs ED Triage Vitals  Encounter Vitals Group     BP 01/08/25 1104 115/78     Girls Systolic BP Percentile --      Girls Diastolic BP Percentile --      Boys Systolic BP Percentile --      Boys Diastolic BP Percentile --      Pulse Rate 01/08/25 1104 64     Resp 01/08/25 1104 14     Temp 01/08/25 1104 98.3 F (36.8 C)     Temp Source 01/08/25 1104 Oral     SpO2 01/08/25 1104 97 %     Weight 01/08/25 1102 141 lb (64 kg)     Height 01/08/25 1102 5' 5 (1.651 m)     Head Circumference --      Peak Flow --      Pain Score 01/08/25 1102 0     Pain Loc --      Pain Education --      Exclude from Growth Chart --    No data found.  Updated Vital Signs BP 115/78 (BP Location: Right Arm)   Pulse 64   Temp 98.3 F (36.8 C) (Oral)   Resp 14   Ht 5' 5 (1.651 m)   Wt 141 lb (64 kg)   LMP 01/05/2025 (Exact Date)   SpO2 97%   Breastfeeding No   BMI 23.46 kg/m   Visual Acuity Right Eye Distance:   Left Eye Distance:   Bilateral Distance:    Right Eye Near:   Left Eye Near:    Bilateral Near:     Physical Exam Vitals and nursing note reviewed.  Constitutional:      General: She is not in acute distress.    Appearance: Normal appearance. She is not toxic-appearing.  HENT:     Mouth/Throat:     Mouth: Mucous membranes are moist.     Pharynx: Oropharynx is clear.  Pulmonary:     Effort: Pulmonary effort is normal. No respiratory distress.  Skin:  General: Skin is warm and dry.     Capillary Refill: Capillary refill takes less than 2 seconds.     Findings: Erythema and rash present. Rash is urticarial.     Comments: Raised, slightly erythematous rash noted to bilateral chest.  There are irregularly shaped plaques of erythema and excoriation marks. No crusting or flaking of the skin.  Rash is not annular.  Neurological:     Mental Status: She is alert and oriented to person, place, and time.  Psychiatric:        Behavior: Behavior is cooperative.      UC  Treatments / Results  Labs (all labs ordered are listed, but only abnormal results are displayed) Labs Reviewed - No data to display  EKG   Radiology No results found.  Procedures Procedures (including critical care time)  Medications Ordered in UC Medications  methylPREDNISolone  acetate (DEPO-MEDROL ) injection 40 mg (40 mg Intramuscular Given 01/08/25 1123)    Initial Impression / Assessment and Plan / UC Course  I have reviewed the triage vital signs and the nursing notes.  Pertinent labs & imaging results that were available during my care of the patient were reviewed by me and considered in my medical decision making (see chart for details).   Patient is a very pleasant, well-appearing 34 year old female presenting today for rash.  Vital signs are stable in triage.  Exam is consistent with contact dermatitis.  Unclear etiology.  Treat with Depo-Medrol  IM in urgent care today, start oral prednisone  tomorrow.  Can also use triamcinolone  ointment up to twice daily as needed-we discussed not to use this for more than 2 weeks in a row.  Oral antihistamine regimen recommended as well.  The patient was given the opportunity to ask questions.  All questions answered to their satisfaction.  The patient is in agreement to this plan.   Final Clinical Impressions(s) / UC Diagnoses   Final diagnoses:  Rash     Discharge Instructions      The rash on your skin is consistent with contact dermatitis, meaning a reaction to something that your skin is coming into contact with.  We are treating the itchy rash with an injection of steroid medicine today.  Start taking the oral prednisone  tomorrow.  You can use a topical ointment up to twice daily as needed for the rash.  If you need this ointment for more than 2 weeks in a row, please follow-up with your primary care provider and do not use for more than 2 weeks.  You can also take an oral antihistamine like Zyrtec, Claritin, or Allegra to help  with itching.  Seek care if symptoms persist or worsen despite treatment.    ED Prescriptions     Medication Sig Dispense Auth. Provider   predniSONE  (STERAPRED UNI-PAK 21 TAB) 10 MG (21) TBPK tablet Take 6 tablets (60 mg total) by mouth daily for 1 day, THEN 5 tablets (50 mg total) daily for 1 day, THEN 4 tablets (40 mg total) daily for 1 day, THEN 3 tablets (30 mg total) daily for 1 day, THEN 2 tablets (20 mg total) daily for 1 day, THEN 1 tablet (10 mg total) daily for 1 day. 21 tablet Chandra Raisin A, NP   triamcinolone  ointment (KENALOG ) 0.1 % Apply 1 Application topically 2 (two) times daily. Apply up to twice daily as needed for itchy rash; do not use for more than 14 days in a row. 15 g Chandra Raisin LABOR, NP  PDMP not reviewed this encounter.    [1]  Social History Tobacco Use   Smoking status: Never   Smokeless tobacco: Never  Vaping Use   Vaping status: Never Used  Substance Use Topics   Alcohol use: Never   Drug use: Never     Chandra Harlene LABOR, NP 01/08/25 1134  "

## 2025-01-08 NOTE — ED Triage Notes (Signed)
 Patient c/o itchy rash on her chest and under her breasts that started 2 weeks ago.  Patient has tried OTC hydrocortisone cream with no relief.

## 2025-01-08 NOTE — Discharge Instructions (Signed)
 The rash on your skin is consistent with contact dermatitis, meaning a reaction to something that your skin is coming into contact with.  We are treating the itchy rash with an injection of steroid medicine today.  Start taking the oral prednisone  tomorrow.  You can use a topical ointment up to twice daily as needed for the rash.  If you need this ointment for more than 2 weeks in a row, please follow-up with your primary care provider and do not use for more than 2 weeks.  You can also take an oral antihistamine like Zyrtec, Claritin, or Allegra to help with itching.  Seek care if symptoms persist or worsen despite treatment.
